# Patient Record
Sex: Female | Born: 1960
Health system: Southern US, Community
[De-identification: ages and names within clinical notes are randomized; demographics above are authoritative.]

## PROBLEM LIST (undated history)

## (undated) DIAGNOSIS — Z972 Presence of dental prosthetic device (complete) (partial): Secondary | ICD-10-CM

## (undated) DIAGNOSIS — E079 Disorder of thyroid, unspecified: Secondary | ICD-10-CM

## (undated) DIAGNOSIS — C801 Malignant (primary) neoplasm, unspecified: Secondary | ICD-10-CM

## (undated) DIAGNOSIS — S161XXA Strain of muscle, fascia and tendon at neck level, initial encounter: Secondary | ICD-10-CM

## (undated) DIAGNOSIS — S39012A Strain of muscle, fascia and tendon of lower back, initial encounter: Secondary | ICD-10-CM

## (undated) DIAGNOSIS — Z973 Presence of spectacles and contact lenses: Secondary | ICD-10-CM

## (undated) DIAGNOSIS — R51 Headache: Secondary | ICD-10-CM

## (undated) HISTORY — DX: Strain of muscle, fascia and tendon of lower back, initial encounter: S39.012A

## (undated) HISTORY — DX: Malignant (primary) neoplasm, unspecified: C80.1

## (undated) HISTORY — DX: Headache: R51

## (undated) HISTORY — DX: Disorder of thyroid, unspecified: E07.9

## (undated) HISTORY — PX: TUBAL LIGATION: SHX77

## (undated) HISTORY — DX: Strain of muscle, fascia and tendon at neck level, initial encounter: S16.1XXA

## (undated) HISTORY — PX: MULTIPLE TOOTH EXTRACTIONS: SHX2053

---

## 1994-11-24 HISTORY — PX: LYMPH NODE BIOPSY: SHX201

## 2010-08-22 ENCOUNTER — Ambulatory Visit: Payer: Self-pay | Admitting: Genetic Counselor

## 2010-08-27 ENCOUNTER — Encounter: Admission: RE | Admit: 2010-08-27 | Discharge: 2010-08-27 | Payer: Self-pay | Admitting: Surgery

## 2010-09-16 ENCOUNTER — Ambulatory Visit (HOSPITAL_COMMUNITY): Admission: RE | Admit: 2010-09-16 | Discharge: 2010-09-16 | Payer: Self-pay | Admitting: Hematology and Oncology

## 2010-09-17 ENCOUNTER — Ambulatory Visit: Payer: Self-pay | Admitting: Cardiology

## 2010-09-20 ENCOUNTER — Ambulatory Visit (HOSPITAL_COMMUNITY): Admission: RE | Admit: 2010-09-20 | Discharge: 2010-09-20 | Payer: Self-pay | Admitting: Surgery

## 2010-11-24 DIAGNOSIS — C801 Malignant (primary) neoplasm, unspecified: Secondary | ICD-10-CM

## 2010-11-24 HISTORY — DX: Malignant (primary) neoplasm, unspecified: C80.1

## 2010-11-24 HISTORY — PX: PORT-A-CATH REMOVAL: SHX5289

## 2010-12-03 ENCOUNTER — Encounter
Admission: RE | Admit: 2010-12-03 | Discharge: 2010-12-03 | Payer: Self-pay | Source: Home / Self Care | Attending: Hematology and Oncology | Admitting: Hematology and Oncology

## 2010-12-14 ENCOUNTER — Encounter: Payer: Self-pay | Admitting: Surgery

## 2011-02-05 LAB — CBC
HCT: 40.4 % (ref 36.0–46.0)
Hemoglobin: 13.7 g/dL (ref 12.0–15.0)
MCH: 29.9 pg (ref 26.0–34.0)
MCHC: 33.9 g/dL (ref 30.0–36.0)
MCV: 88.2 fL (ref 78.0–100.0)
Platelets: 233 10*3/uL (ref 150–400)
RBC: 4.58 MIL/uL (ref 3.87–5.11)
RDW: 12 % (ref 11.5–15.5)
WBC: 7.4 10*3/uL (ref 4.0–10.5)

## 2011-02-05 LAB — GLUCOSE, CAPILLARY: Glucose-Capillary: 110 mg/dL — ABNORMAL HIGH (ref 70–99)

## 2011-02-05 LAB — SURGICAL PCR SCREEN
MRSA, PCR: NEGATIVE
Staphylococcus aureus: NEGATIVE

## 2011-02-17 ENCOUNTER — Other Ambulatory Visit: Payer: Self-pay | Admitting: Hematology and Oncology

## 2011-02-17 DIAGNOSIS — C50911 Malignant neoplasm of unspecified site of right female breast: Secondary | ICD-10-CM

## 2011-02-24 ENCOUNTER — Ambulatory Visit
Admission: RE | Admit: 2011-02-24 | Discharge: 2011-02-24 | Disposition: A | Discharge status: Home / Self Care | Payer: Self-pay | Source: Ambulatory Visit | Attending: Hematology and Oncology | Admitting: Hematology and Oncology

## 2011-02-24 DIAGNOSIS — C50911 Malignant neoplasm of unspecified site of right female breast: Secondary | ICD-10-CM

## 2011-02-24 MED ORDER — GADOBENATE DIMEGLUMINE 529 MG/ML IV SOLN
10.0000 mL | Freq: Once | INTRAVENOUS | Status: AC | PRN
  Administered 2011-02-24: 10 mL via INTRAVENOUS

## 2011-03-06 ENCOUNTER — Other Ambulatory Visit (HOSPITAL_COMMUNITY): Payer: Self-pay | Admitting: Surgery

## 2011-03-06 DIAGNOSIS — C50911 Malignant neoplasm of unspecified site of right female breast: Secondary | ICD-10-CM

## 2011-04-07 ENCOUNTER — Encounter (HOSPITAL_COMMUNITY)
Admission: RE | Admit: 2011-04-07 | Discharge: 2011-04-07 | Disposition: A | Discharge status: Home / Self Care | Payer: BC Managed Care – PPO | Source: Ambulatory Visit | Attending: Surgery | Admitting: Surgery

## 2011-04-07 LAB — BASIC METABOLIC PANEL
BUN: 7 mg/dL (ref 6–23)
CO2: 32 mEq/L (ref 19–32)
Calcium: 10.2 mg/dL (ref 8.4–10.5)
Chloride: 102 mEq/L (ref 96–112)
Creatinine, Ser: 0.65 mg/dL (ref 0.4–1.2)
GFR calc Af Amer: >60 mL/min (ref >60)
GFR calc non Af Amer: >60 mL/min (ref >60)
Glucose, Bld: 101 mg/dL — ABNORMAL HIGH (ref 70–99)
Potassium: 4.2 mEq/L (ref 3.5–5.1)
Sodium: 141 mEq/L (ref 135–145)

## 2011-04-07 LAB — CBC
HCT: 39.9 % (ref 36.0–46.0)
Hemoglobin: 13.9 g/dL (ref 12.0–15.0)
MCH: 31.9 pg (ref 26.0–34.0)
MCHC: 34.8 g/dL (ref 30.0–36.0)
MCV: 91.5 fL (ref 78.0–100.0)
Platelets: 230 10*3/uL (ref 150–400)
RBC: 4.36 MIL/uL (ref 3.87–5.11)
RDW: 12.6 % (ref 11.5–15.5)
WBC: 4.9 10*3/uL (ref 4.0–10.5)

## 2011-04-07 LAB — SURGICAL PCR SCREEN
MRSA, PCR: NEGATIVE
Staphylococcus aureus: NEGATIVE

## 2011-04-10 ENCOUNTER — Ambulatory Visit (HOSPITAL_COMMUNITY)
Admission: RE | Admit: 2011-04-10 | Discharge: 2011-04-10 | Disposition: A | Discharge status: Home / Self Care | Payer: BC Managed Care – PPO | Source: Ambulatory Visit | Attending: Surgery | Admitting: Surgery

## 2011-04-10 ENCOUNTER — Other Ambulatory Visit (INDEPENDENT_AMBULATORY_CARE_PROVIDER_SITE_OTHER): Payer: Self-pay | Admitting: Surgery

## 2011-04-10 ENCOUNTER — Observation Stay (HOSPITAL_COMMUNITY)
Admission: RE | Admit: 2011-04-10 | Discharge: 2011-04-11 | Disposition: A | Discharge status: Home / Self Care | Payer: BC Managed Care – PPO | Source: Ambulatory Visit | Attending: Surgery | Admitting: Surgery

## 2011-04-10 DIAGNOSIS — Z01812 Encounter for preprocedural laboratory examination: Secondary | ICD-10-CM | POA: Insufficient documentation

## 2011-04-10 DIAGNOSIS — Z452 Encounter for adjustment and management of vascular access device: Secondary | ICD-10-CM | POA: Insufficient documentation

## 2011-04-10 DIAGNOSIS — Z79899 Other long term (current) drug therapy: Secondary | ICD-10-CM | POA: Insufficient documentation

## 2011-04-10 DIAGNOSIS — C50119 Malignant neoplasm of central portion of unspecified female breast: Principal | ICD-10-CM | POA: Insufficient documentation

## 2011-04-10 DIAGNOSIS — C50911 Malignant neoplasm of unspecified site of right female breast: Secondary | ICD-10-CM

## 2011-04-10 DIAGNOSIS — F17200 Nicotine dependence, unspecified, uncomplicated: Secondary | ICD-10-CM | POA: Insufficient documentation

## 2011-04-10 HISTORY — PX: BREAST SURGERY: SHX581

## 2011-04-10 MED ORDER — TECHNETIUM TC 99M SULFUR COLLOID FILTERED
1.0000 | Freq: Once | INTRAVENOUS | Status: AC | PRN
  Administered 2011-04-10: 1 via INTRADERMAL

## 2011-04-15 NOTE — Op Note (Signed)
[name], [name]                ACCOUNT NO.:  [id number]  MEDICAL RECORD NO.:  [id number]           PATIENT TYPE:  O  LOCATION:  SDSC                         FACILITY:  [facility]  PHYSICIAN:  Sandria Bales. Ezzard Standing, M.D.  DATE OF BIRTH:  1961-11-06  DATE OF PROCEDURE: 10 Apr 2011                              OPERATIVE REPORT   PREOPERATIVE DIAGNOSIS:  Central right breast cancer.  POSTOPERATIVE DIAGNOSIS:  Central right breast cancer with negative sentinel lymph node biopsy (per Dr. Guerry Bruin).  PROCEDURE:  Injection of methylene blue into right breast, right axillary sentinel lymph node biopsy (blue node with counts of 3300, background of 10), right simple mastectomy, removal of left subclavian Port-A-Cath.  SURGEON:  Sandria Bales. Ezzard Standing, MD  ASSISTANT:  None.  ANESTHESIA:  General endotracheal.  INDICATION FOR PROCEDURE:  Ms. 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  She was found to have a right breast cancer and underwent neoadjuvant chemotherapy supervised by Dr. Isabel Caprice.  Her treatment was completed on February 24, 2011.  She underwent an MRI that showed complete resolution of her tumor.  I have discussed with her about further treatment.  Because her breast was so small and this tumor was subareolar, I think she is best served by having a mastectomy with areolar excision.  We will plan a sentinel lymph node at the same time.  If it is positive, do an axillary node dissection.  I talked to her about breast reconstruction, but at this time she wants to hold off.  There is also a chance she would need radiation therapy post surgery and we will remove her Port-A-Cath at the same time.  The risks of surgery include, but are not limited to bleeding, infection, nerve injury, recurrence of the tumor and the possibility of requiring more surgery.  OPERATIVE NOTE:  The patient placed in supine  position, underwent general anesthesia in room 2 at [facility].  She had been injected in the preoperative area with 1 mCi of technetium sulfur colloid in the right subareolar space for identification of sentinel lymph node.  A time-out was held and the surgical checklist run.  Her right breast was prepped with ChloraPrep and sterilely draped.  Also included the site of the Port-A-Cath in her left subclavian area.  I started with injecting her subareolar area with about 1 cc of methylene blue.  This is for help identifying the sentinel lymph node.  I then made incision in the right axilla where I found a hot blue node.  I excised this node.  Hemostasis was controlled with Bovie electrocautery.  The final counts of the node were about 3300 as the highest with a background in the radiation to the left axilla of about 100.  I then turned my attention to her left mastectomy.  Dr. Guerry Bruin called back and said the sentinel lymph node was negative, so I did no further surgery at this time on her axillary lymph nodes.  I then turned my attention to her right mastectomy.  I made  an elliptical incision including the areola.  I went medially to the edge of the sternum, superiorly to about 3 fingerbreadths below the clavicle and went laterally to the edge of the latissimus dorsi muscle and inferiorly to the investing fascia of the rectus abdominis muscle.  I then excised the breast tissue and the subcutaneous fat with it.  She really has thin and has very very small breasts.  The specimen was removed off the pectoralis major muscle.  Hemostasis was controlled with Bovie electrocautery and then I put a 3-0 silk suture in the lateral part of the specimen, identifying as lateral right breast.  I then brought a Blake drain through a stab wound inferior flap.  I placed this in position under the flap, closed the mastectomy flap with 3-0 Vicryl subcutaneous suture and 4-0 Monocryl subcuticular  suture.  I did the same in the right axilla and then painted the wounds with tincture of benzoin and Steri-Strips.  I then changed gloves, gown, instruments and did the Port-A-Cath removal of the left subclavian space.  I cut down the Port-A-Cath, excised in its entirety.  I closed the space with a 2-0 Vicryl suture, the skin with a 4-0 Monocryl suture.  Steri-Strips on the right chest wall and axilla and Dermabond on the left.  We then dressed it with 4 x 4's and wrapped the chest for pressure.  I will plan to keep her overnight for observation.  She was transferred to recovery room in good condition.    Sandria Bales. Ezzard Standing, M.D., FACS   DHN/MEDQ  D:  04/10/2011  T:  04/10/2011  Job:  [id number]  cc:   Paulene Floor, M.D.  Lynett Fish, M.D.  Electronically Signed by Ovidio Kin M.D. on 04/15/2011 10:50:54 AM

## 2011-04-15 NOTE — Discharge Summary (Signed)
[name], [name]                ACCOUNT NO.:  [id number]  MEDICAL RECORD NO.:  [id number]           PATIENT TYPE:  O  LOCATION:  5159                         FACILITY:  [facility]  PHYSICIAN:  Sandria Bales. Ezzard Standing, M.D.  DATE OF BIRTH:  [birth year]  DATE OF ADMISSION:  04/10/2011 DATE OF DISCHARGE:  04/11/2011                              DISCHARGE SUMMARY   DISCHARGE DIAGNOSES: 1. Right breast cancer, triple negative, status post neoadjuvant     therapy, final pathology pending. 2. Smokes cigarettes. 3. On thyroid replacement. 4. History of kidney stones.  OPERATIONS PERFORMED:  The patient underwent an injection of methylene blue, a right axillary sentinel lymph node biopsy, a right simple mastectomy, and removal of left subclavian Port-A-Cath on Apr 10, 2011.  HISTORY OF PRESENT ILLNESS:  [name] is a 50 year old white female who sees Paulene Floor, family nurse practitioner, at the [facility], who was found to have an asymmetry of her right breast, so a mammogram was done at [facility], read by Lois Huxley.  Pathology by Dr. Broadus John revealed a high-grade invasive ductal carcinoma, which was ER negative, PR negative, HER-2/neu negative and Ki-67 was 56%.  [name] has small breasts.  This tumor seemed to impinge almost the whole depth of her breast tissue of her right breast immediately below the nipple, and discussion was carried out about the options for treatment.  It was felt she would be best served by undergoing neoadjuvant chemotherapy and saw Dr. Isabel Caprice.  She underwent treatment with Adriamycin and Cytoxan followed by Taxol and she has now completed that treatment.  She has had significant resolution of right breast cancer, in which a followup MRI done on February 24, 2011, showed no significant residual enhancement of her right breast.  Her comorbid problems include history of smoking, but she now know it is bad for  health.  She is on thyroid replacement and has a history of kidney stones.  A fairly lengthy discussion was carried out with the patient about the options for treatment.  It was felt because of her small breasts and because of the closeness to the nipple that she was best served with a simple mastectomy including removing the nipple areolar complex.  I discussed with her about breast reconstruction, which at this time she did not want to pursue.  We will do a sentinel lymph node biopsy and if positive, proceed with an axillary node dissection and since her chemotherapy is complete, we will remove her Port-A-Cath.  She was admitted to the hospital on Apr 10, 2011, where she was taken to the operating room where she underwent injection of methylene blue and identification of a right axillary sentinel lymph node, which preliminary reports were negative on pathology.  She had a right simple mastectomy and removal of subclavian Port-A-Cath.  She is now 1 day postop.  She has done well.  She is afebrile.  She understands the management of her Jackson-Pratt drain and is ready for discharge.  She will resume her home medication which includes fluoxetine 10 mg daily, alprazolam 0.25 mg as needed,  and levothyroxine 100 mcg daily.  She can drive after 3 or 4 days.   She can be on a regular diet.   She is to leave the bandage on until Sunday, Apr 13, 2011, when she can remove it to get the shower.   She has an appointment to see me next week and will be given Vicodin for pain.   Her final pathology is pending at the time of this dictation.   Sandria Bales. Ezzard Standing, M.D., FACS   DHN/MEDQ  D:  04/11/2011  T:  04/11/2011  Job:  [id number]  cc:   Paulene Floor, M.D. Lynett Fish, M.D.  Electronically Signed by Ovidio Kin M.D. on 04/15/2011 10:53:32 AM

## 2011-04-23 ENCOUNTER — Encounter (INDEPENDENT_AMBULATORY_CARE_PROVIDER_SITE_OTHER): Payer: Self-pay | Admitting: Surgery

## 2011-09-22 ENCOUNTER — Encounter (INDEPENDENT_AMBULATORY_CARE_PROVIDER_SITE_OTHER): Payer: Self-pay | Admitting: Surgery

## 2011-10-22 ENCOUNTER — Encounter (INDEPENDENT_AMBULATORY_CARE_PROVIDER_SITE_OTHER): Payer: Self-pay | Admitting: Surgery

## 2011-11-06 ENCOUNTER — Encounter (INDEPENDENT_AMBULATORY_CARE_PROVIDER_SITE_OTHER): Payer: Self-pay | Admitting: Surgery

## 2011-11-17 ENCOUNTER — Encounter (INDEPENDENT_AMBULATORY_CARE_PROVIDER_SITE_OTHER): Payer: Self-pay | Admitting: Hematology and Oncology

## 2011-11-20 ENCOUNTER — Telehealth (INDEPENDENT_AMBULATORY_CARE_PROVIDER_SITE_OTHER): Payer: Self-pay | Admitting: Surgery

## 2011-12-25 ENCOUNTER — Encounter (INDEPENDENT_AMBULATORY_CARE_PROVIDER_SITE_OTHER): Payer: Self-pay | Admitting: Surgery

## 2011-12-26 ENCOUNTER — Encounter (INDEPENDENT_AMBULATORY_CARE_PROVIDER_SITE_OTHER): Payer: Self-pay | Admitting: Surgery

## 2011-12-26 ENCOUNTER — Ambulatory Visit (INDEPENDENT_AMBULATORY_CARE_PROVIDER_SITE_OTHER): Payer: BC Managed Care – PPO | Admitting: Surgery

## 2011-12-26 VITALS — BP 130/82 | HR 90 | Temp 97.3°F | Resp 18 | Ht 68.0 in | Wt 103.5 lb

## 2011-12-26 DIAGNOSIS — C50919 Malignant neoplasm of unspecified site of unspecified female breast: Secondary | ICD-10-CM

## 2011-12-26 DIAGNOSIS — C50911 Malignant neoplasm of unspecified site of right female breast: Secondary | ICD-10-CM

## 2011-12-26 NOTE — Progress Notes (Signed)
[facility]  Ovidio Kin, MD,  FACS [address] [place]  [address] [place], [place]    [address] Phone:  [phone] FAX:  [phone]   Re:   [name] DOB:   07/30/1961 MRN:   [id number]  ASSESSMENT AND PLAN: 1.  Right breast cancer, triple negative.  Measured 1.7 cm prior to treatment.  Neoadjuvant chemotx by Dr. Cleone Slim.  CR at mastectomy on 04/10/2011.  Disease free.  She'll see me back in 6 months.  2.  Smokes cigarettes.  HISTORY OF PRESENT ILLNESS: Chief Complaint  Patient presents with  . Breast Cancer Long Term Follow Up    [name] is a 51 y.o. (DOB: 02-21-1961)  white female who is a patient of Bennie Pierini, FNP, FNP and comes to me today for follow up of right mastectomy for right breast cancer.  She looks good. Is in good spirits.  Has no new problems or complaints.  We talked about trying to quit smoking.  She is cutting back and knows that she needs to quit.  PHYSICAL EXAM: BP 130/82  Pulse 90  Temp(Src) 97.3 F (36.3 C) (Temporal)  Resp 18  Ht 5\' 8"  (1.727 m)  Wt 103 lb 8 oz (46.947 kg)  BMI 15.74 kg/m2  HEENT:  Pupils equal.  Has yellowed dentures.  Smells of cigarettes. NECK:  Supple.  No thyroid mass. LYMPH NODES:  No cervical, supraclavicular, or axillary adenopathy. BREASTS -  RIGHT:  No palpable mass or nodule.  Absent right breast.   LEFT:  No palpable mass or nodule.  No nipple discharge. UPPER EXTREMITIES:  No evidence of lymphedema.  DATA REVIEWED: Bilateral MRI on 03/06/2011. Negative left mammogram on 08/05/2011.  Ovidio Kin, MD, FACS Office:  [phone]

## 2012-01-01 ENCOUNTER — Encounter (INDEPENDENT_AMBULATORY_CARE_PROVIDER_SITE_OTHER): Payer: Self-pay

## 2012-03-24 ENCOUNTER — Ambulatory Visit: Payer: BC Managed Care – PPO | Attending: Neurology | Admitting: Physical Therapy

## 2012-03-24 DIAGNOSIS — M545 Low back pain, unspecified: Secondary | ICD-10-CM | POA: Insufficient documentation

## 2012-03-24 DIAGNOSIS — M542 Cervicalgia: Secondary | ICD-10-CM | POA: Insufficient documentation

## 2012-03-24 DIAGNOSIS — R5381 Other malaise: Secondary | ICD-10-CM | POA: Insufficient documentation

## 2012-03-24 DIAGNOSIS — IMO0001 Reserved for inherently not codable concepts without codable children: Secondary | ICD-10-CM | POA: Insufficient documentation

## 2012-03-25 ENCOUNTER — Ambulatory Visit: Payer: BC Managed Care – PPO | Admitting: Physical Therapy

## 2012-03-30 ENCOUNTER — Ambulatory Visit: Payer: BC Managed Care – PPO | Admitting: Physical Therapy

## 2012-04-02 ENCOUNTER — Ambulatory Visit: Payer: BC Managed Care – PPO | Admitting: *Deleted

## 2012-04-07 ENCOUNTER — Ambulatory Visit: Payer: BC Managed Care – PPO | Admitting: Physical Therapy

## 2012-04-08 ENCOUNTER — Ambulatory Visit: Payer: BC Managed Care – PPO | Admitting: Physical Therapy

## 2012-04-13 ENCOUNTER — Ambulatory Visit: Payer: BC Managed Care – PPO | Admitting: Physical Therapy

## 2012-04-16 ENCOUNTER — Ambulatory Visit: Payer: BC Managed Care – PPO | Admitting: Physical Therapy

## 2012-04-21 ENCOUNTER — Ambulatory Visit: Payer: BC Managed Care – PPO | Admitting: Physical Therapy

## 2012-04-22 ENCOUNTER — Ambulatory Visit: Payer: BC Managed Care – PPO | Admitting: Physical Therapy

## 2012-04-26 ENCOUNTER — Ambulatory Visit: Payer: BC Managed Care – PPO | Attending: Neurology | Admitting: Physical Therapy

## 2012-04-26 DIAGNOSIS — M545 Low back pain, unspecified: Secondary | ICD-10-CM | POA: Insufficient documentation

## 2012-04-26 DIAGNOSIS — R5381 Other malaise: Secondary | ICD-10-CM | POA: Insufficient documentation

## 2012-04-26 DIAGNOSIS — IMO0001 Reserved for inherently not codable concepts without codable children: Secondary | ICD-10-CM | POA: Insufficient documentation

## 2012-04-26 DIAGNOSIS — M542 Cervicalgia: Secondary | ICD-10-CM | POA: Insufficient documentation

## 2012-04-30 ENCOUNTER — Ambulatory Visit: Payer: BC Managed Care – PPO | Admitting: Physical Therapy

## 2012-05-05 ENCOUNTER — Ambulatory Visit: Payer: BC Managed Care – PPO | Admitting: Physical Therapy

## 2012-05-06 ENCOUNTER — Ambulatory Visit: Payer: BC Managed Care – PPO | Admitting: Physical Therapy

## 2012-05-10 ENCOUNTER — Ambulatory Visit: Payer: BC Managed Care – PPO | Admitting: *Deleted

## 2012-05-11 ENCOUNTER — Encounter: Payer: BC Managed Care – PPO | Admitting: Hematology and Oncology

## 2012-05-11 DIAGNOSIS — C50919 Malignant neoplasm of unspecified site of unspecified female breast: Secondary | ICD-10-CM

## 2012-05-11 DIAGNOSIS — E039 Hypothyroidism, unspecified: Secondary | ICD-10-CM

## 2012-05-14 ENCOUNTER — Ambulatory Visit: Payer: BC Managed Care – PPO | Admitting: Physical Therapy

## 2012-06-02 ENCOUNTER — Ambulatory Visit: Payer: BC Managed Care – PPO | Attending: Neurology | Admitting: Physical Therapy

## 2012-06-02 DIAGNOSIS — IMO0001 Reserved for inherently not codable concepts without codable children: Secondary | ICD-10-CM | POA: Insufficient documentation

## 2012-06-02 DIAGNOSIS — M545 Low back pain, unspecified: Secondary | ICD-10-CM | POA: Insufficient documentation

## 2012-06-02 DIAGNOSIS — R5381 Other malaise: Secondary | ICD-10-CM | POA: Insufficient documentation

## 2012-06-02 DIAGNOSIS — M542 Cervicalgia: Secondary | ICD-10-CM | POA: Insufficient documentation

## 2012-06-07 ENCOUNTER — Ambulatory Visit: Payer: BC Managed Care – PPO | Admitting: Physical Therapy

## 2012-06-11 ENCOUNTER — Ambulatory Visit: Payer: BC Managed Care – PPO | Admitting: *Deleted

## 2012-06-16 ENCOUNTER — Ambulatory Visit: Payer: BC Managed Care – PPO | Admitting: Physical Therapy

## 2012-06-17 ENCOUNTER — Ambulatory Visit: Payer: BC Managed Care – PPO | Admitting: *Deleted

## 2012-06-21 ENCOUNTER — Ambulatory Visit: Payer: BC Managed Care – PPO | Admitting: Physical Therapy

## 2012-06-25 ENCOUNTER — Ambulatory Visit: Payer: BC Managed Care – PPO | Attending: Family Medicine | Admitting: *Deleted

## 2012-06-25 DIAGNOSIS — M542 Cervicalgia: Secondary | ICD-10-CM | POA: Insufficient documentation

## 2012-06-25 DIAGNOSIS — IMO0001 Reserved for inherently not codable concepts without codable children: Secondary | ICD-10-CM | POA: Insufficient documentation

## 2012-06-25 DIAGNOSIS — M545 Low back pain, unspecified: Secondary | ICD-10-CM | POA: Insufficient documentation

## 2012-06-25 DIAGNOSIS — R5381 Other malaise: Secondary | ICD-10-CM | POA: Insufficient documentation

## 2012-06-30 ENCOUNTER — Ambulatory Visit: Payer: BC Managed Care – PPO | Admitting: Physical Therapy

## 2012-07-01 ENCOUNTER — Ambulatory Visit: Payer: BC Managed Care – PPO | Admitting: Physical Therapy

## 2012-07-23 ENCOUNTER — Ambulatory Visit (INDEPENDENT_AMBULATORY_CARE_PROVIDER_SITE_OTHER): Payer: BC Managed Care – PPO | Admitting: Surgery

## 2012-07-23 ENCOUNTER — Encounter (INDEPENDENT_AMBULATORY_CARE_PROVIDER_SITE_OTHER): Payer: Self-pay | Admitting: Surgery

## 2012-07-23 VITALS — BP 100/70 | HR 84 | Temp 97.0°F | Resp 18 | Ht 68.0 in | Wt 107.0 lb

## 2012-07-23 DIAGNOSIS — C50919 Malignant neoplasm of unspecified site of unspecified female breast: Secondary | ICD-10-CM

## 2012-07-23 DIAGNOSIS — C50911 Malignant neoplasm of unspecified site of right female breast: Secondary | ICD-10-CM

## 2012-07-23 NOTE — Progress Notes (Signed)
[facility]  Ovidio Kin, MD,  FACS [address] [place]  [address] [place], [place]    [address] Phone:  [phone] FAX:  [phone]   Re:   [name] DOB:   1961-06-13 MRN:   [id number]  ASSESSMENT AND PLAN: 1.  Right breast cancer, triple negative.  Measured 1.7 cm prior to treatment.  Neoadjuvant chemotx by Dr. Cleone Slim.  CR at mastectomy on 04/10/2011.  Disease free.  She'll see me back in 6 months.  2.  Whiplash - seeing Dr. Anne Hahn  No obvious fx.  Trouble turning head to left.  From [facility] - 12/01/2011 - she's gone through 24 PT treatments 2.  Smokes cigarettes.  Has tried to stop  HISTORY OF PRESENT ILLNESS: Chief Complaint  Patient presents with  . Breast Cancer Long Term Follow Up    [name] is a 51 y.o. (DOB: 1961/05/14)  white female who is a patient of MARTIN,MARY MARGARET, FNP and comes to me today for follow up of right mastectomy for right breast cancer.  She looks good. Is in good spirits.  Her main complaint is neck pain from whiplash.  She has trouble turning her head to the left.  PHYSICAL EXAM: BP 100/70  Pulse 84  Temp 97 F (36.1 C) (Oral)  Resp 18  Ht 5\' 8"  (1.727 m)  Wt 107 lb (48.535 kg)  BMI 16.27 kg/m2  HEENT:  Pupils equal.  Has yellowed dentures.  Smells of cigarettes. NECK:  Supple.  No thyroid mass. LYMPH NODES:  No cervical, supraclavicular, or axillary adenopathy. BREASTS -  RIGHT:  No palpable mass or nodule.  Absent right breast.   LEFT:  No palpable mass or nodule.  No nipple discharge. UPPER EXTREMITIES:  No evidence of lymphedema.  DATA REVIEWED: Negative left mammogram on 08/05/2011. For mammogram in Sept. 2013. Ovidio Kin, MD, FACS Office:  [phone]

## 2012-08-17 ENCOUNTER — Encounter (INDEPENDENT_AMBULATORY_CARE_PROVIDER_SITE_OTHER): Payer: BC Managed Care – PPO | Admitting: Hematology and Oncology

## 2012-08-17 DIAGNOSIS — M5090 Cervical disc disorder, unspecified, unspecified cervical region: Secondary | ICD-10-CM

## 2012-08-17 DIAGNOSIS — C50919 Malignant neoplasm of unspecified site of unspecified female breast: Secondary | ICD-10-CM

## 2012-08-23 ENCOUNTER — Ambulatory Visit: Payer: BC Managed Care – PPO | Attending: Family Medicine | Admitting: *Deleted

## 2012-08-23 DIAGNOSIS — R5381 Other malaise: Secondary | ICD-10-CM | POA: Insufficient documentation

## 2012-08-23 DIAGNOSIS — M545 Low back pain, unspecified: Secondary | ICD-10-CM | POA: Insufficient documentation

## 2012-08-23 DIAGNOSIS — IMO0001 Reserved for inherently not codable concepts without codable children: Secondary | ICD-10-CM | POA: Insufficient documentation

## 2012-08-23 DIAGNOSIS — M542 Cervicalgia: Secondary | ICD-10-CM | POA: Insufficient documentation

## 2012-08-25 ENCOUNTER — Ambulatory Visit: Payer: BC Managed Care – PPO | Attending: Family Medicine | Admitting: *Deleted

## 2012-08-25 DIAGNOSIS — M542 Cervicalgia: Secondary | ICD-10-CM | POA: Insufficient documentation

## 2012-08-25 DIAGNOSIS — R5381 Other malaise: Secondary | ICD-10-CM | POA: Insufficient documentation

## 2012-08-25 DIAGNOSIS — IMO0001 Reserved for inherently not codable concepts without codable children: Secondary | ICD-10-CM | POA: Insufficient documentation

## 2012-08-25 DIAGNOSIS — M545 Low back pain, unspecified: Secondary | ICD-10-CM | POA: Insufficient documentation

## 2012-08-30 ENCOUNTER — Ambulatory Visit: Payer: BC Managed Care – PPO | Admitting: Physical Therapy

## 2012-09-01 ENCOUNTER — Ambulatory Visit: Payer: BC Managed Care – PPO | Admitting: Physical Therapy

## 2012-09-06 ENCOUNTER — Ambulatory Visit: Payer: BC Managed Care – PPO | Admitting: Physical Therapy

## 2012-09-08 ENCOUNTER — Ambulatory Visit: Payer: BC Managed Care – PPO | Admitting: Physical Therapy

## 2012-09-13 ENCOUNTER — Ambulatory Visit: Payer: BC Managed Care – PPO | Admitting: Physical Therapy

## 2012-09-17 ENCOUNTER — Ambulatory Visit: Payer: BC Managed Care – PPO | Admitting: Physical Therapy

## 2012-09-22 ENCOUNTER — Encounter: Payer: BC Managed Care – PPO | Admitting: Physical Therapy

## 2012-09-23 ENCOUNTER — Ambulatory Visit: Payer: BC Managed Care – PPO | Admitting: Physical Therapy

## 2012-09-27 ENCOUNTER — Ambulatory Visit: Payer: BC Managed Care – PPO | Attending: Family Medicine | Admitting: Physical Therapy

## 2012-09-27 DIAGNOSIS — R5381 Other malaise: Secondary | ICD-10-CM | POA: Insufficient documentation

## 2012-09-27 DIAGNOSIS — IMO0001 Reserved for inherently not codable concepts without codable children: Secondary | ICD-10-CM | POA: Insufficient documentation

## 2012-09-27 DIAGNOSIS — M542 Cervicalgia: Secondary | ICD-10-CM | POA: Insufficient documentation

## 2012-09-27 DIAGNOSIS — M545 Low back pain, unspecified: Secondary | ICD-10-CM | POA: Insufficient documentation

## 2012-12-01 DIAGNOSIS — R519 Headache, unspecified: Secondary | ICD-10-CM | POA: Insufficient documentation

## 2012-12-01 DIAGNOSIS — S139XXA Sprain of joints and ligaments of unspecified parts of neck, initial encounter: Secondary | ICD-10-CM | POA: Insufficient documentation

## 2012-12-01 DIAGNOSIS — S339XXA Sprain of unspecified parts of lumbar spine and pelvis, initial encounter: Secondary | ICD-10-CM | POA: Insufficient documentation

## 2012-12-02 ENCOUNTER — Other Ambulatory Visit: Payer: Self-pay | Admitting: Neurology

## 2012-12-02 DIAGNOSIS — S139XXA Sprain of joints and ligaments of unspecified parts of neck, initial encounter: Secondary | ICD-10-CM

## 2012-12-02 DIAGNOSIS — R519 Headache, unspecified: Secondary | ICD-10-CM

## 2012-12-02 DIAGNOSIS — S161XXA Strain of muscle, fascia and tendon at neck level, initial encounter: Secondary | ICD-10-CM

## 2012-12-09 ENCOUNTER — Ambulatory Visit
Admission: RE | Admit: 2012-12-09 | Discharge: 2012-12-09 | Disposition: A | Discharge status: Home / Self Care | Payer: BC Managed Care – PPO | Source: Ambulatory Visit | Attending: Neurology | Admitting: Neurology

## 2012-12-09 VITALS — BP 150/95 | HR 80

## 2012-12-09 DIAGNOSIS — S139XXA Sprain of joints and ligaments of unspecified parts of neck, initial encounter: Secondary | ICD-10-CM

## 2012-12-09 DIAGNOSIS — R519 Headache, unspecified: Secondary | ICD-10-CM

## 2012-12-09 DIAGNOSIS — M5020 Other cervical disc displacement, unspecified cervical region: Secondary | ICD-10-CM

## 2012-12-09 DIAGNOSIS — S161XXA Strain of muscle, fascia and tendon at neck level, initial encounter: Secondary | ICD-10-CM

## 2012-12-09 MED ORDER — IOHEXOL 300 MG/ML  SOLN
1.0000 mL | Freq: Once | INTRAMUSCULAR | Status: AC | PRN
  Administered 2012-12-09: 1 mL via EPIDURAL

## 2012-12-09 MED ORDER — TRIAMCINOLONE ACETONIDE 40 MG/ML IJ SUSP (RADIOLOGY)
60.0000 mg | Freq: Once | INTRAMUSCULAR | Status: AC
  Administered 2012-12-09: 60 mg via EPIDURAL

## 2012-12-27 ENCOUNTER — Telehealth (INDEPENDENT_AMBULATORY_CARE_PROVIDER_SITE_OTHER): Payer: Self-pay

## 2012-12-27 NOTE — Telephone Encounter (Signed)
V/M appt 02/10/13 @ 210p

## 2013-02-10 ENCOUNTER — Ambulatory Visit (INDEPENDENT_AMBULATORY_CARE_PROVIDER_SITE_OTHER): Payer: BC Managed Care – PPO | Admitting: Surgery

## 2013-02-10 VITALS — BP 128/74 | HR 78 | Temp 97.8°F | Resp 18 | Ht 67.5 in | Wt 115.0 lb

## 2013-02-10 DIAGNOSIS — C50911 Malignant neoplasm of unspecified site of right female breast: Secondary | ICD-10-CM

## 2013-02-10 DIAGNOSIS — C50919 Malignant neoplasm of unspecified site of unspecified female breast: Secondary | ICD-10-CM

## 2013-02-10 NOTE — Progress Notes (Signed)
[facility]  Ovidio Kin, MD,  FACS [address] [place]  [address] [place], [place]    [address] Phone:  [phone] FAX:  [phone]   Re:   [name] DOB:   Apr 07, 1961 MRN:   [id number]  ASSESSMENT AND PLAN: 1.  Right breast cancer, triple negative.  (T1c,N0)  Measured 1.7 cm prior to treatment.  Neoadjuvant chemotx by Dr. Cleone Slim.  She is to see Dr. Arcelia Jew in follow up.  CR at mastectomy on 04/10/2011.  Disease free.  She'll see me back in 6 months.  2.  Whiplash - from [facility] - 12/01/2011 - seeing Dr. Anne Hahn  She' went through 24 PT treatments  She got an injection of her C spine - 12/09/2012. 2.  Smokes cigarettes.  Has tried to stop  HISTORY OF PRESENT ILLNESS: Chief Complaint  Patient presents with  . Breast Cancer Long Term Follow Up   [name] is a 52 y.o. (DOB: 07/04/1961)  white female who is a patient of MARTIN,MARY MARGARET, FNP and comes to me today for follow up of right mastectomy for right breast cancer.  She looks good. Is in good spirits. She got an injection of her C spine - 12/09/2012.  Her neck is better. She is still trying to quit smoking.  She smokes about 2-4 cigs per day.  Social History: She works at [employer]. Her daughter bugs her about quitting smoking.  PHYSICAL EXAM: BP 128/74  Pulse 78  Temp(Src) 97.8 F (36.6 C)  Resp 18  Ht 5' 7.5" (1.715 m)  Wt 115 lb (52.164 kg)  BMI 17.74 kg/m2  HEENT:  Pupils equal.  Has yellowed dentures.  Smells of cigarettes. NECK:  Supple.  No thyroid mass. LYMPH NODES:  No cervical, supraclavicular, or axillary adenopathy. BREASTS -  RIGHT:  No palpable mass or nodule.  Absent right breast.   LEFT:  No palpable mass or nodule.  No nipple discharge. UPPER EXTREMITIES:  No evidence of lymphedema.  DATA REVIEWED: Negative left mammogram - Sept 2013. For mammogram in Sept. 2014.  Ovidio Kin, MD, FACS Office:  [phone]

## 2013-02-15 ENCOUNTER — Encounter (INDEPENDENT_AMBULATORY_CARE_PROVIDER_SITE_OTHER): Payer: BC Managed Care – PPO | Admitting: Internal Medicine

## 2013-02-15 DIAGNOSIS — C50919 Malignant neoplasm of unspecified site of unspecified female breast: Secondary | ICD-10-CM

## 2013-02-15 DIAGNOSIS — F411 Generalized anxiety disorder: Secondary | ICD-10-CM

## 2013-02-28 ENCOUNTER — Other Ambulatory Visit: Payer: Self-pay | Admitting: Neurology

## 2013-03-23 ENCOUNTER — Ambulatory Visit (INDEPENDENT_AMBULATORY_CARE_PROVIDER_SITE_OTHER): Payer: BC Managed Care – PPO | Admitting: Neurology

## 2013-03-23 ENCOUNTER — Encounter: Payer: Self-pay | Admitting: Neurology

## 2013-03-23 VITALS — BP 126/84 | HR 89 | Ht 68.0 in | Wt 117.0 lb

## 2013-03-23 DIAGNOSIS — R51 Headache: Secondary | ICD-10-CM

## 2013-03-23 DIAGNOSIS — S339XXA Sprain of unspecified parts of lumbar spine and pelvis, initial encounter: Secondary | ICD-10-CM

## 2013-03-23 DIAGNOSIS — S139XXA Sprain of joints and ligaments of unspecified parts of neck, initial encounter: Secondary | ICD-10-CM

## 2013-03-23 DIAGNOSIS — Z5189 Encounter for other specified aftercare: Secondary | ICD-10-CM

## 2013-03-23 DIAGNOSIS — S139XXD Sprain of joints and ligaments of unspecified parts of neck, subsequent encounter: Secondary | ICD-10-CM

## 2013-03-23 NOTE — Progress Notes (Signed)
Reason for visit: Cervical strain  [name] is an 52 y.o. female  History of present illness:  [name] is a 52 year old right-handed white female with a history of a cervical strain syndrome. The patient has done much better since last seen. The patient had an epidural steroid injection, and she believes that this helped significantly. The patient indicates that she is still on the nortriptyline at 20 mg at night, and she is having some days when she feels normal. The pain and mobility of the cervical spine had dramatically improved. The patient turned for an evaluation.  Past Medical History  Diagnosis Date  . Chronic kidney disease   . Cancer 2012    Breast  . Thyroid disease   . Headache   . Cervical muscle strain   . Lumbar strain     Past Surgical History  Procedure Laterality Date  . Breast surgery  04/10/2011    Right mastectomy and slnbx    Family History  Problem Relation Age of Onset  . Stroke Mother   . Hypertension Mother   . Heart disease Mother   . Cancer Maternal Grandmother     Breast cancer    Social history:  reports that she has quit smoking. She does not have any smokeless tobacco history on file. She reports that she drinks about 1.0 ounces of alcohol per week. She reports that she does not use illicit drugs.  Allergies:  Allergies  Allergen Reactions  . Sulfa Antibiotics Other (Rowe Comments)    Makes mouth raw  . Codeine Nausea And Vomiting    Medications:  Current Outpatient Prescriptions on File Prior to Visit  Medication Sig Dispense Refill  . ALPRAZolam (XANAX) 0.25 MG tablet Take 0.25 mg by mouth daily. Take 2 a day      . Calcium Carbonate-Vitamin D (CALCIUM + D PO) Take by mouth.        . meloxicam (MOBIC) 15 MG tablet TAKE ONE TABLET BY MOUTH EVERY DAY WITH FOOD  90 tablet  2  . Multiple Vitamin (MULTIVITAMIN) capsule Take 1 capsule by mouth daily.        . nortriptyline (PAMELOR) 10 MG capsule Take 10 mg by mouth 2 (two)  times daily.      Marland Kitchen venlafaxine (EFFEXOR) 37.5 MG tablet Take 37.5 mg by mouth daily.       No current facility-administered medications on file prior to visit.    ROS:  Out of a complete 14 system review of symptoms, the patient complains only of the following symptoms, and all other reviewed systems are negative.  Achy muscles  Blood pressure 126/84, pulse 89, height 5\' 8"  (1.727 m), weight 117 lb (53.071 kg).  Physical Exam  General: The patient is alert and cooperative at the time of the examination.  Neuromuscular: The patient lacks only about 5 or 10 of full lateral rotation with rotational movement of the cervical spine.  Skin: No significant peripheral edema is noted.   Neurologic Exam  Cranial nerves: Facial symmetry is present. Speech is normal, no aphasia or dysarthria is noted. Extraocular movements are full. Visual fields are full.  Motor: The patient has good strength in all 4 extremities.  Coordination: The patient has good finger-nose-finger and heel-to-shin bilaterally.  Gait and station: The patient has a normal gait. Tandem gait is normal. Romberg is negative. No drift is seen.  Reflexes: Deep tendon reflexes are symmetric.   Assessment/Plan:  One. Cervical strain syndrome  The patient  is doing much better with her underlying neuromuscular pain. The patient will be set up for another epidural steroid injection, and she will followup through this office in about 4 months. The patient is to remain on nortriptyline for now.  Marlan Palau MD 03/23/2013 7:45 PM  [facility] [address] [place], [place] [address]  Phone [phone] Fax [phone]

## 2013-03-24 ENCOUNTER — Other Ambulatory Visit: Payer: Self-pay | Admitting: Neurology

## 2013-03-25 ENCOUNTER — Other Ambulatory Visit: Payer: Self-pay | Admitting: Neurology

## 2013-03-25 DIAGNOSIS — M542 Cervicalgia: Secondary | ICD-10-CM

## 2013-04-06 ENCOUNTER — Ambulatory Visit
Admission: RE | Admit: 2013-04-06 | Discharge: 2013-04-06 | Disposition: A | Discharge status: Home / Self Care | Payer: BC Managed Care – PPO | Source: Ambulatory Visit | Attending: Neurology | Admitting: Neurology

## 2013-04-06 DIAGNOSIS — M542 Cervicalgia: Secondary | ICD-10-CM

## 2013-04-06 MED ORDER — IOHEXOL 300 MG/ML  SOLN
1.0000 mL | Freq: Once | INTRAMUSCULAR | Status: AC | PRN
  Administered 2013-04-06: 1 mL via EPIDURAL

## 2013-04-06 MED ORDER — TRIAMCINOLONE ACETONIDE 40 MG/ML IJ SUSP (RADIOLOGY)
60.0000 mg | Freq: Once | INTRAMUSCULAR | Status: AC
  Administered 2013-04-06: 60 mg via EPIDURAL

## 2013-08-15 ENCOUNTER — Ambulatory Visit (INDEPENDENT_AMBULATORY_CARE_PROVIDER_SITE_OTHER): Payer: BC Managed Care – PPO | Admitting: Family Medicine

## 2013-08-15 ENCOUNTER — Telehealth: Payer: Self-pay | Admitting: Nurse Practitioner

## 2013-08-15 ENCOUNTER — Ambulatory Visit (INDEPENDENT_AMBULATORY_CARE_PROVIDER_SITE_OTHER): Payer: BC Managed Care – PPO

## 2013-08-15 ENCOUNTER — Encounter: Payer: Self-pay | Admitting: Family Medicine

## 2013-08-15 VITALS — BP 143/87 | HR 96 | Temp 97.0°F | Ht 68.25 in | Wt 111.0 lb

## 2013-08-15 DIAGNOSIS — J209 Acute bronchitis, unspecified: Secondary | ICD-10-CM

## 2013-08-15 DIAGNOSIS — R059 Cough, unspecified: Secondary | ICD-10-CM

## 2013-08-15 DIAGNOSIS — C50911 Malignant neoplasm of unspecified site of right female breast: Secondary | ICD-10-CM

## 2013-08-15 DIAGNOSIS — R05 Cough: Secondary | ICD-10-CM

## 2013-08-15 DIAGNOSIS — C50919 Malignant neoplasm of unspecified site of unspecified female breast: Secondary | ICD-10-CM

## 2013-08-15 DIAGNOSIS — Z87891 Personal history of nicotine dependence: Secondary | ICD-10-CM

## 2013-08-15 MED ORDER — LEVOFLOXACIN 750 MG PO TABS: 750.0000 mg | ORAL_TABLET | Freq: Every day | ORAL | Status: DC

## 2013-08-15 NOTE — Telephone Encounter (Signed)
Appt given for today 

## 2013-08-15 NOTE — Progress Notes (Signed)
Patient ID: [name], female   DOB: [birth year], 52 y.o.   MRN: [id number] SUBJECTIVE: CC: Chief Complaint  Patient presents with  . Acute Visit    sinus cough and side hurts from coughing     HPI: As above. Left  Chest sore when she coughs. H/o breast cancer. Ex-smoker. No hemoptysis. No sob, no wheezing. No fever. Her sinuses are congested.   Past Medical History  Diagnosis Date  . Chronic kidney disease   . Thyroid disease   . Headache(784.0)   . Cervical muscle strain   . Lumbar strain   . Cancer 2012    Breast   Past Surgical History  Procedure Laterality Date  . Breast surgery  04/10/2011    Right mastectomy and slnbx   History   Social History  . Marital Status: Divorced    Spouse Name: N/A    Number of Children: 1  . Years of Education: HS   Occupational History  .     Social History Main Topics  . Smoking status: Former Smoker -- 0.50 packs/day  . Smokeless tobacco: Not on file  . Alcohol Use: 1.0 oz/week    2 drink(s) per week     Comment: 1-2 DRINKS WEEKLY  . Drug Use: No  . Sexual Activity: Not on file   Other Topics Concern  . Not on file   Social History Narrative  . No narrative on file   Family History  Problem Relation Age of Onset  . Stroke Mother   . Hypertension Mother   . Heart disease Mother   . Cancer Maternal Grandmother     Breast cancer   Current Outpatient Prescriptions on File Prior to Visit  Medication Sig Dispense Refill  . ALPRAZolam (XANAX) 0.25 MG tablet Take 0.25 mg by mouth daily. Take 2 a day      . Calcium Carbonate-Vitamin D (CALCIUM + D PO) Take by mouth.        . levothyroxine (SYNTHROID, LEVOTHROID) 88 MCG tablet Take 88 mcg by mouth daily before breakfast.      . meloxicam (MOBIC) 15 MG tablet TAKE ONE TABLET BY MOUTH EVERY DAY WITH FOOD  90 tablet  2  . Multiple Vitamin (MULTIVITAMIN) capsule Take 1 capsule by mouth daily.        . nortriptyline (PAMELOR) 10 MG capsule Take 10 mg by mouth 2 (two) times  daily.      Marland Kitchen venlafaxine (EFFEXOR) 37.5 MG tablet Take 37.5 mg by mouth daily.       No current facility-administered medications on file prior to visit.   Allergies  Allergen Reactions  . Sulfa Antibiotics Other (See Comments)    Makes mouth raw  . Codeine Nausea And Vomiting    There is no immunization history on file for this patient. Prior to Admission medications   Medication Sig Start Date End Date Taking? Authorizing Provider  ALPRAZolam (XANAX) 0.25 MG tablet Take 0.25 mg by mouth daily. Take 2 a day   Yes Historical Provider, MD  Calcium Carbonate-Vitamin D (CALCIUM + D PO) Take by mouth.     Yes Historical Provider, MD  levothyroxine (SYNTHROID, LEVOTHROID) 88 MCG tablet Take 88 mcg by mouth daily before breakfast.   Yes Historical Provider, MD  meloxicam (MOBIC) 15 MG tablet TAKE ONE TABLET BY MOUTH EVERY DAY WITH FOOD 02/28/13  Yes York Spaniel, MD  Multiple Vitamin (MULTIVITAMIN) capsule Take 1 capsule by mouth daily.     Yes Historical  Provider, MD  nortriptyline (PAMELOR) 10 MG capsule Take 10 mg by mouth 2 (two) times daily.   Yes Historical Provider, MD  venlafaxine (EFFEXOR) 37.5 MG tablet Take 37.5 mg by mouth daily.   Yes Historical Provider, MD   Past Medical History  Diagnosis Date  . Chronic kidney disease   . Thyroid disease   . Headache(784.0)   . Cervical muscle strain   . Lumbar strain   . Cancer 2012    Breast   Past Surgical History  Procedure Laterality Date  . Breast surgery  04/10/2011    Right mastectomy and slnbx   History   Social History  . Marital Status: Divorced    Spouse Name: N/A    Number of Children: 1  . Years of Education: HS   Occupational History  .     Social History Main Topics  . Smoking status: Former Smoker -- 0.50 packs/day  . Smokeless tobacco: Not on file  . Alcohol Use: 1.0 oz/week    2 drink(s) per week     Comment: 1-2 DRINKS WEEKLY  . Drug Use: No  . Sexual Activity: Not on file   Other Topics  Concern  . Not on file   Social History Narrative  . No narrative on file   Family History  Problem Relation Age of Onset  . Stroke Mother   . Hypertension Mother   . Heart disease Mother   . Cancer Maternal Grandmother     Breast cancer   Current Outpatient Prescriptions on File Prior to Visit  Medication Sig Dispense Refill  . ALPRAZolam (XANAX) 0.25 MG tablet Take 0.25 mg by mouth daily. Take 2 a day      . Calcium Carbonate-Vitamin D (CALCIUM + D PO) Take by mouth.        . levothyroxine (SYNTHROID, LEVOTHROID) 88 MCG tablet Take 88 mcg by mouth daily before breakfast.      . meloxicam (MOBIC) 15 MG tablet TAKE ONE TABLET BY MOUTH EVERY DAY WITH FOOD  90 tablet  2  . Multiple Vitamin (MULTIVITAMIN) capsule Take 1 capsule by mouth daily.        . nortriptyline (PAMELOR) 10 MG capsule Take 10 mg by mouth 2 (two) times daily.      Marland Kitchen venlafaxine (EFFEXOR) 37.5 MG tablet Take 37.5 mg by mouth daily.       No current facility-administered medications on file prior to visit.   Allergies  Allergen Reactions  . Sulfa Antibiotics Other (See Comments)    Makes mouth raw  . Codeine Nausea And Vomiting    There is no immunization history on file for this patient. Prior to Admission medications   Medication Sig Start Date End Date Taking? Authorizing Provider  ALPRAZolam (XANAX) 0.25 MG tablet Take 0.25 mg by mouth daily. Take 2 a day   Yes Historical Provider, MD  Calcium Carbonate-Vitamin D (CALCIUM + D PO) Take by mouth.     Yes Historical Provider, MD  levothyroxine (SYNTHROID, LEVOTHROID) 88 MCG tablet Take 88 mcg by mouth daily before breakfast.   Yes Historical Provider, MD  meloxicam (MOBIC) 15 MG tablet TAKE ONE TABLET BY MOUTH EVERY DAY WITH FOOD 02/28/13  Yes York Spaniel, MD  Multiple Vitamin (MULTIVITAMIN) capsule Take 1 capsule by mouth daily.     Yes Historical Provider, MD  nortriptyline (PAMELOR) 10 MG capsule Take 10 mg by mouth 2 (two) times daily.   Yes Historical  Provider, MD  venlafaxine [facility])  37.5 MG tablet Take 37.5 mg by mouth daily.   Yes Historical Provider, MD     ROS: As above in the HPI. All other systems are stable or negative.  OBJECTIVE: APPEARANCE:  Patient in no acute distress.The patient appeared well nourished and normally developed. Acyanotic. Waist: VITAL SIGNS:BP 143/87  Pulse 96  Temp(Src) 97 F (36.1 C) (Oral)  Ht 5' 8.25" (1.734 m)  Wt 111 lb (50.349 kg)  BMI 16.75 kg/m2 WF  SKIN: warm and  Dry without overt rashes, tattoos and scars  HEAD and Neck: without JVD, Head and scalp: normal Eyes:No scleral icterus. Fundi normal, eye movements normal. Ears: Auricle normal, canal normal, Tympanic membranes normal, insufflation normal. Nose: normal Throat: normal Neck & thyroid: normal  CHEST & LUNGS: Chest wall: normal Lungs: Clear  CVS: Reveals the PMI to be normally located. Regular rhythm, First and Second Heart sounds are normal,  absence of murmurs, rubs or gallops. Peripheral vasculature: Radial pulses: normal Dorsal pedis pulses: normal Posterior pulses: normal  ABDOMEN:  Appearance: normal Benign, no organomegaly, no masses, no Abdominal Aortic enlargement. No Guarding , no rebound. No Bruits. Bowel sounds: normal  RECTAL: N/A GU: N/A  EXTREMETIES: nonedematous.  MUSCULOSKELETAL:  Spine: normal Joints: intact  NEUROLOGIC: oriented to time,place and person; nonfocal. Strength is normal Sensory is normal Reflexes are normal Cranial Nerves are normal.  ASSESSMENT: Cough - Plan: DG Chest 2 View, levofloxacin (LEVAQUIN) 750 MG tablet  Breast cancer, right breast, Triple negative, CR to neoaduvant chemo, mastectomy 04/10/2011. - Plan: levofloxacin (LEVAQUIN) 750 MG tablet  Acute bronchitis - Plan: levofloxacin (LEVAQUIN) 750 MG tablet  Ex-smoker - Plan: levofloxacin (LEVAQUIN) 750 MG tablet   PLAN: Orders Placed This Encounter  Procedures  . DG Chest 2 View    Standing Status:  Future     Number of Occurrences: 1     Standing Expiration Date: 10/15/2014    Order Specific Question:  Reason for Exam (SYMPTOM  OR DIAGNOSIS REQUIRED)    Answer:  cough    Order Specific Question:  Is the patient pregnant?    Answer:  No    Order Specific Question:  Preferred imaging location?    Answer:  Internal   Meds ordered this encounter  Medications  . levofloxacin (LEVAQUIN) 750 MG tablet    Sig: Take 1 tablet (750 mg total) by mouth daily.    Dispense:  7 tablet    Refill:  0   [facility] reading (PRIMARY) by  Dr. Modesto Charon: hyperinflation, small area of infiltrate LLL vs nipple shadow.  Return in about 10 days (around 08/25/2013) for recheck cough.  Note for work.  Blakelynn Scheeler P. Modesto Charon, M.D.

## 2013-08-19 ENCOUNTER — Encounter: Payer: Self-pay | Admitting: Neurology

## 2013-08-19 ENCOUNTER — Ambulatory Visit (INDEPENDENT_AMBULATORY_CARE_PROVIDER_SITE_OTHER): Payer: BC Managed Care – PPO | Admitting: Neurology

## 2013-08-19 VITALS — BP 130/84 | HR 97 | Ht 68.0 in | Wt 112.0 lb

## 2013-08-19 DIAGNOSIS — S139XXA Sprain of joints and ligaments of unspecified parts of neck, initial encounter: Secondary | ICD-10-CM

## 2013-08-19 DIAGNOSIS — S139XXS Sprain of joints and ligaments of unspecified parts of neck, sequela: Secondary | ICD-10-CM

## 2013-08-19 NOTE — Progress Notes (Signed)
Reason for visit: Cervical spondylosis  [name] is an 52 y.o. female  History of present illness:  [name] is a 52 year old right-handed white female with a history of cervical spondylosis and cervical strain syndrome. The patient has done relatively well over time, when last seen in April of 2014, the patient underwent another epidural steroid injection that seemed to help. The patient is on nortriptyline and Mobic which has potentiated her benefit. The patient overall is doing quite well. The patient recently was placed on Levaquin for pneumonia. The patient returns for an evaluation.  Past Medical History  Diagnosis Date  . Chronic kidney disease   . Thyroid disease   . Headache(784.0)   . Cervical muscle strain   . Lumbar strain   . Cancer 2012    Breast    Past Surgical History  Procedure Laterality Date  . Breast surgery  04/10/2011    Right mastectomy and slnbx    Family History  Problem Relation Age of Onset  . Stroke Mother   . Hypertension Mother   . Heart disease Mother   . Cancer Maternal Grandmother     Breast cancer    Social history:  reports that she has quit smoking. Her smoking use included Cigarettes. She has a 15 pack-year smoking history. She has never used smokeless tobacco. She reports that she drinks about 1.2 ounces of alcohol per week. She reports that she does not use illicit drugs.    Allergies  Allergen Reactions  . Sulfa Antibiotics Other (Rowe Comments)    Makes mouth raw  . Codeine Nausea And Vomiting    Medications:  Current Outpatient Prescriptions on File Prior to Visit  Medication Sig Dispense Refill  . ALPRAZolam (XANAX) 0.25 MG tablet Take 0.25 mg by mouth 2 (two) times daily as needed. Take 2 a day      . Calcium Carbonate-Vitamin D (CALCIUM + D PO) Take by mouth.        . levofloxacin (LEVAQUIN) 750 MG tablet Take 1 tablet (750 mg total) by mouth daily.  7 tablet  0  . levothyroxine (SYNTHROID, LEVOTHROID) 88 MCG  tablet Take 88 mcg by mouth daily before breakfast.      . meloxicam (MOBIC) 15 MG tablet TAKE ONE TABLET BY MOUTH EVERY DAY WITH FOOD  90 tablet  2  . Multiple Vitamin (MULTIVITAMIN) capsule Take 1 capsule by mouth daily.        . nortriptyline (PAMELOR) 10 MG capsule Take 10 mg by mouth 2 (two) times daily.      Marland Kitchen venlafaxine (EFFEXOR) 37.5 MG tablet Take 37.5 mg by mouth daily.       No current facility-administered medications on file prior to visit.    ROS:  Out of a complete 14 system review of symptoms, the patient complains only of the following symptoms, and all other reviewed systems are negative.  Cough  Blood pressure 130/84, pulse 97, height 5\' 8"  (1.727 m), weight 112 lb (50.803 kg).  Physical Exam  General: The patient is alert and cooperative at the time of the examination.  Neuromuscular: The patient has good range of movement of the cervical spine, some crepitus is noted.  Skin: No significant peripheral edema is noted.   Neurologic Exam  Cranial nerves: Facial symmetry is present. Speech is normal, no aphasia or dysarthria is noted. Extraocular movements are full. Visual fields are full.  Motor: The patient has good strength in all 4 extremities.  Coordination: The  patient has good finger-nose-finger and heel-to-shin bilaterally.  Gait and station: The patient has a normal gait. Tandem gait is normal. Romberg is negative. No drift is seen.  Reflexes: Deep tendon reflexes are symmetric.   Assessment/Plan:  One. Cervical spondylosis, cervical strain  The patient doing well so well at this point. We will continue low-dose nortriptyline and Mobic through the winter, and if she continues to do well, this could be discontinued upon revisit. The patient seems to be at or near her baseline.  Susan Palau MD 08/19/2013 8:19 AM  [facility] [address] [place], [place] [address]  Phone [phone] Fax [phone]

## 2013-08-25 ENCOUNTER — Ambulatory Visit: Payer: BC Managed Care – PPO | Admitting: Family Medicine

## 2013-10-12 ENCOUNTER — Ambulatory Visit (INDEPENDENT_AMBULATORY_CARE_PROVIDER_SITE_OTHER): Payer: BC Managed Care – PPO | Admitting: Family Medicine

## 2013-10-12 VITALS — BP 132/80 | HR 99 | Temp 97.7°F | Ht 68.0 in | Wt 114.0 lb

## 2013-10-12 DIAGNOSIS — M79645 Pain in left finger(s): Secondary | ICD-10-CM

## 2013-10-12 DIAGNOSIS — M79609 Pain in unspecified limb: Secondary | ICD-10-CM

## 2013-10-13 ENCOUNTER — Ambulatory Visit (INDEPENDENT_AMBULATORY_CARE_PROVIDER_SITE_OTHER): Payer: BC Managed Care – PPO

## 2013-10-13 DIAGNOSIS — M79645 Pain in left finger(s): Secondary | ICD-10-CM

## 2013-10-13 DIAGNOSIS — M79609 Pain in unspecified limb: Secondary | ICD-10-CM

## 2013-10-14 NOTE — Progress Notes (Signed)
  Subjective:    Patient ID: Hall Busing, female    DOB: 1961-09-10, 52 y.o.   MRN: [id number]  HPI This 52 y.o. female presents for evaluation of pain and swelling in her fifth finger. She fell and now her left hand and pinky finger hurt.   Review of Systems No chest pain, SOB, HA, dizziness, vision change, N/V, diarrhea, constipation, dysuria, urinary urgency or frequency, myalgias, arthralgias or rash.     Objective:   Physical Exam  Vital signs noted  Well developed well nourished female.  Respiratory - Lungs CTA bilateral Cardiac - RRR S1 and S2 without murmur  GI - Abdomen soft Nontender and bowel sounds active x 4 Extremities - No edema. Neuro - Grossly intact. MS - Left fifth finger with swelling at DIP  Xray - fracture of distal fifth metacarpal     Assessment & Plan:  Finger pain, left - Plan: DG Finger Little Left Splint applied and will refer to orthopedics.  Deatra Canter FNP

## 2013-10-17 ENCOUNTER — Encounter: Payer: Self-pay | Admitting: Family Medicine

## 2013-10-17 NOTE — Addendum Note (Signed)
Addended by: Bearl Mulberry on: 10/17/2013 02:33 PM   Modules accepted: Orders

## 2014-02-17 ENCOUNTER — Ambulatory Visit (INDEPENDENT_AMBULATORY_CARE_PROVIDER_SITE_OTHER): Payer: BC Managed Care – PPO | Admitting: Nurse Practitioner

## 2014-02-17 ENCOUNTER — Encounter: Payer: Self-pay | Admitting: Nurse Practitioner

## 2014-02-17 ENCOUNTER — Encounter (INDEPENDENT_AMBULATORY_CARE_PROVIDER_SITE_OTHER): Payer: Self-pay

## 2014-02-17 VITALS — BP 156/97 | HR 92 | Ht 68.0 in | Wt 116.0 lb

## 2014-02-17 DIAGNOSIS — S139XXA Sprain of joints and ligaments of unspecified parts of neck, initial encounter: Secondary | ICD-10-CM

## 2014-02-17 DIAGNOSIS — R51 Headache: Secondary | ICD-10-CM

## 2014-02-17 MED ORDER — NORTRIPTYLINE HCL 10 MG PO CAPS: 10.0000 mg | ORAL_CAPSULE | Freq: Two times a day (BID) | ORAL | Status: DC

## 2014-02-17 NOTE — Progress Notes (Signed)
[facility]  PATIENT: [name] DOB: 03-06-1961   REASON FOR VISIT: Followup for cervical strain syndrome   HISTORY OF PRESENT ILLNESS: [name] returns for followup. In terms of her neck pain she is doing better and wants to get off of her Mobic. She intermittently does her exercises which have been beneficial. Pamelor has been good for her headaches. She returns for reevaluation  HISTORY:  of cervical spondylosis and cervical strain syndrome. The patient has done relatively well over time, when last seen in April of 2014, the patient underwent another epidural steroid injection that seemed to help. The patient is on nortriptyline and Mobic which has potentiated her benefit. The patient overall is doing quite well. The patient recently was placed on Levaquin for pneumonia. The patient returns for an evaluation.   REVIEW OF SYSTEMS: Full 14 system review of systems performed and notable only for those listed, all others are neg:  Constitutional: N/A  Cardiovascular: N/A  Ear/Nose/Throat: N/A  Skin: N/A  Eyes: N/A  Respiratory: N/A  Gastroitestinal: N/A  Hematology/Lymphatic: N/A  Endocrine: N/A Musculoskeletal:N/A  Allergy/Immunology: N/A  Neurological: N/A Psychiatric: N/A   ALLERGIES: Allergies  Allergen Reactions  . Sulfa Antibiotics Other (See Comments)    Makes mouth raw  . Codeine Nausea And Vomiting    HOME MEDICATIONS: Outpatient Prescriptions Prior to Visit  Medication Sig Dispense Refill  . Calcium Carbonate-Vitamin D (CALCIUM + D PO) Take by mouth.        . levothyroxine (SYNTHROID, LEVOTHROID) 88 MCG tablet Take 88 mcg by mouth daily before breakfast.      . meloxicam (MOBIC) 15 MG tablet TAKE ONE TABLET BY MOUTH EVERY DAY WITH FOOD  90 tablet  2  . Multiple Vitamin (MULTIVITAMIN) capsule Take 1 capsule by mouth daily.        . nortriptyline (PAMELOR) 10 MG capsule Take 10 mg by mouth 2 (two) times daily.      Marland Kitchen venlafaxine  (EFFEXOR) 37.5 MG tablet Take 37.5 mg by mouth daily.      Marland Kitchen ALPRAZolam (XANAX) 0.25 MG tablet Take 0.25 mg by mouth 2 (two) times daily as needed. Take 2 a day       No facility-administered medications prior to visit.    PAST MEDICAL HISTORY: Past Medical History  Diagnosis Date  . Chronic kidney disease   . Thyroid disease   . Headache(784.0)   . Cervical muscle strain   . Lumbar strain   . Cancer 2012    Breast    PAST SURGICAL HISTORY: Past Surgical History  Procedure Laterality Date  . Breast surgery  04/10/2011    Right mastectomy and slnbx    FAMILY HISTORY: Family History  Problem Relation Age of Onset  . Stroke Mother   . Hypertension Mother   . Heart disease Mother   . Cancer Maternal Grandmother     Breast cancer    SOCIAL HISTORY: History   Social History  . Marital Status: Divorced    Spouse Name: N/A    Number of Children: 1  . Years of Education: HS   Occupational History  .      [employer]   Social History Main Topics  . Smoking status: Former Smoker -- 0.50 packs/day for 30 years    Types: Cigarettes  . Smokeless tobacco: Never Used  . Alcohol Use: 1.2 oz/week    2 Cans of beer per week     Comment: 1-2 DRINKS WEEKLY  .  Drug Use: No  . Sexual Activity: Not on file   Other Topics Concern  . Not on file   Social History Narrative   Patient lives at home with her boyfriend and daughter. Patient works Full time.    High school education.   Right handed.   Caffeine- two cups daily- coffee.     PHYSICAL EXAM  Filed Vitals:   02/17/14 1057  BP: 156/97  Pulse: 92  Height: 5\' 8"  (1.727 m)  Weight: 116 lb (52.617 kg)   Body mass index is 17.64 kg/(m^2).  Generalized: Well developed, in no acute distress  Neck: Supple, good ROM  Neurological examination   Mentation: Alert oriented to time, place, history taking. Follows all commands speech and language fluent  Cranial nerve II-XII: Pupils were equal round reactive to light  extraocular movements were full, visual field were full on confrontational test. Facial sensation and strength were normal. hearing was intact to finger rubbing bilaterally. Uvula tongue midline. head turning and shoulder shrug were normal and symmetric.Tongue protrusion into cheek strength was normal. Motor: normal bulk and tone, full strength in the BUE, BLE, fine finger movements normal, no pronator drift. No focal weakness Coordination: finger-nose-finger, heel-to-shin bilaterally, no dysmetria Reflexes: Brachioradialis 2/2, biceps 2/2, triceps 2/2, patellar 2/2, Achilles 2/2, plantar responses were flexor bilaterally. Gait and Station: Rising up from seated position without assistance, normal stance,  moderate stride, good arm swing, smooth turning, able to perform tiptoe, and heel walking without difficulty. Tandem gait is steady  DIAGNOSTIC DATA (LABS, IMAGING, TESTING) - ASSESSMENT AND PLAN  53 y.o. year old female  has a past medical history of  Cervical muscle strain; Lumbar strain; and headache here to follow up. She is doing well  May discontinue Mobic Continue Pamelor at night Continue neck exercises at least 2 times daily F/U 6 to 8 months Dennie Bible, [facility], [facility], APRN  [facility] [address] [place], [place] [address] [phone]

## 2014-02-17 NOTE — Progress Notes (Signed)
I have read the note, and I agree with the clinical assessment and plan.  [name] KEITH   

## 2014-02-17 NOTE — Patient Instructions (Signed)
May discontinue Mobic Continue Pamelor at night Continue neck exercises at least 2 times daily F/U 6 to 8 months

## 2014-05-30 ENCOUNTER — Telehealth: Payer: Self-pay | Admitting: Family Medicine

## 2014-05-30 NOTE — Telephone Encounter (Signed)
appt given for thurs

## 2014-06-01 ENCOUNTER — Ambulatory Visit (INDEPENDENT_AMBULATORY_CARE_PROVIDER_SITE_OTHER): Payer: BC Managed Care – PPO

## 2014-06-01 ENCOUNTER — Ambulatory Visit (INDEPENDENT_AMBULATORY_CARE_PROVIDER_SITE_OTHER): Payer: BC Managed Care – PPO | Admitting: Nurse Practitioner

## 2014-06-01 ENCOUNTER — Encounter: Payer: Self-pay | Admitting: Nurse Practitioner

## 2014-06-01 VITALS — BP 117/76 | HR 100 | Temp 98.0°F | Ht 68.0 in | Wt 114.6 lb

## 2014-06-01 DIAGNOSIS — M25569 Pain in unspecified knee: Secondary | ICD-10-CM

## 2014-06-01 DIAGNOSIS — M545 Low back pain, unspecified: Secondary | ICD-10-CM

## 2014-06-01 DIAGNOSIS — M25561 Pain in right knee: Secondary | ICD-10-CM

## 2014-06-01 MED ORDER — MELOXICAM 15 MG PO TABS: 15.0000 mg | ORAL_TABLET | Freq: Every day | ORAL | Status: DC

## 2014-06-01 MED ORDER — CYCLOBENZAPRINE HCL 5 MG PO TABS: 5.0000 mg | ORAL_TABLET | Freq: Three times a day (TID) | ORAL | Status: DC | PRN

## 2014-06-01 NOTE — Progress Notes (Signed)
   Subjective:    Patient ID: [name], female    DOB: 11-11-1961, 53 y.o.   MRN: [id number]  Back Pain This is a recurrent problem. The current episode started more than 1 month ago. The problem occurs intermittently. The problem has been gradually worsening since onset. The pain is present in the sacro-iliac. The pain does not radiate. The pain is moderate. The pain is worse during the day. The symptoms are aggravated by sitting. She has tried home exercises for the symptoms. The treatment provided mild relief.  Knee Pain  The incident occurred more than 1 week ago. There was no injury mechanism. The pain is present in the right knee. The pain is at a severity of 6/10. The pain is moderate. The pain has been intermittent since onset. She reports no foreign bodies present. The symptoms are aggravated by movement. She has tried heat and NSAIDs for the symptoms. The treatment provided mild relief.      Review of Systems  Constitutional: Negative.   HENT: Negative.   Eyes: Negative.   Respiratory: Negative.   Cardiovascular: Negative.   Gastrointestinal: Negative.   Endocrine: Negative.   Genitourinary: Negative.   Musculoskeletal: Positive for back pain.  Skin: Negative.   Allergic/Immunologic: Negative.   Neurological: Negative.   Hematological: Negative.   Psychiatric/Behavioral: Negative.        Objective:   Physical Exam  Constitutional: She is oriented to person, place, and time. She appears well-developed.  HENT:  Head: Normocephalic.  Eyes: Pupils are equal, round, and reactive to light.  Neck: Normal range of motion.  Cardiovascular: Normal rate.   Pulmonary/Chest: Effort normal.  Abdominal: Soft.  Musculoskeletal:  FROM of right knee with crepitus  On flexion and extension- no effusion- no patella tenderness  FROM of lumbar spine with pain on flexion a nd extension Neg SLR bil  Neurological: She is alert and oriented to person, place, and time. She has normal  reflexes.  Skin: Skin is warm.    BP 117/76  Pulse 100  Temp(Src) 98 F (36.7 C) (Oral)  Ht 5\' 8"  (1.727 m)  Wt 114 lb 9.6 oz (51.982 kg)  BMI 17.43 kg/m2  Right knee- degenerative changes-Preliminary reading by Ronnald Collum, FNP  [facility] Lumbar x ray- lumbar curvature-Preliminary reading by Ronnald Collum, FNP  [facility]      Assessment & Plan:   1. Knee pain, acute, right   2. Midline low back pain without sciatica    Meds ordered this encounter  Medications  . meloxicam (MOBIC) 15 MG tablet    Sig: Take 1 tablet (15 mg total) by mouth daily.    Dispense:  30 tablet    Refill:  2    Order Specific Question:  Supervising Provider    Answer:  Chipper Herb [[id number]]  . cyclobenzaprine (FLEXERIL) 5 MG tablet    Sig: Take 1 tablet (5 mg total) by mouth 3 (three) times daily as needed for muscle spasms.    Dispense:  30 tablet    Refill:  1    Order Specific Question:  Supervising Provider    Answer:  Chipper Herb [[id number]]   Moist heat to knee and back Rest when you can No heavy lifting  Mary-Margaret Hassell Done, FNP

## 2014-06-01 NOTE — Patient Instructions (Signed)

## 2014-09-20 ENCOUNTER — Ambulatory Visit (INDEPENDENT_AMBULATORY_CARE_PROVIDER_SITE_OTHER): Payer: BC Managed Care – PPO | Admitting: Adult Health

## 2014-09-20 ENCOUNTER — Encounter: Payer: Self-pay | Admitting: Adult Health

## 2014-09-20 VITALS — BP 107/71 | HR 91 | Ht 68.0 in | Wt 117.8 lb

## 2014-09-20 DIAGNOSIS — R51 Headache: Secondary | ICD-10-CM

## 2014-09-20 DIAGNOSIS — S139XXD Sprain of joints and ligaments of unspecified parts of neck, subsequent encounter: Secondary | ICD-10-CM

## 2014-09-20 DIAGNOSIS — R519 Headache, unspecified: Secondary | ICD-10-CM

## 2014-09-20 MED ORDER — NORTRIPTYLINE HCL 10 MG PO CAPS: 10.0000 mg | ORAL_CAPSULE | Freq: Every day | ORAL | Status: DC

## 2014-09-20 NOTE — Patient Instructions (Signed)

## 2014-09-20 NOTE — Progress Notes (Signed)
I have read the note, and I agree with the clinical assessment and plan.  WILLIS,CHARLES KEITH   

## 2014-09-20 NOTE — Progress Notes (Signed)
PATIENT: [name] DOB: 05-14-1961  REASON FOR VISIT: follow up HISTORY FROM: patient  HISTORY OF PRESENT ILLNESS: [name] is a 53 year old female with a history of neck pain. She returns today for follow-up. She is currently taking nortriptyline. She reports that her neck pain has improved. She states that she can now turn her head whereas before she could not turn it to the left.  Patient states that she was on Mobic for back pain recently and has since stopped. She states that she feels that the mobic was helpful for her back and may have to restart it. No new medical issues since last seen.   HISTORY 02/17/14 (CM): [name] returns for followup. In terms of her neck pain she is doing better and wants to get off of her Mobic. She intermittently does her exercises which have been beneficial. Pamelor has been good for her headaches. She returns for reevaluation   REVIEW OF SYSTEMS: Full 14 system review of systems performed and notable only for:  Constitutional: N/A  Eyes: N/A Ear/Nose/Throat: N/A  Skin: N/A  Cardiovascular: N/A  Respiratory: N/A  Gastrointestinal: N/A  Genitourinary: N/A Hematology/Lymphatic: N/A  Endocrine: N/A Musculoskeletal:N/A  Allergy/Immunology: N/A  Neurological: N/A Psychiatric: N/A Sleep: N/A   ALLERGIES: Allergies  Allergen Reactions  . Sulfa Antibiotics Other (See Comments)    Makes mouth raw  . Codeine Nausea And Vomiting    HOME MEDICATIONS: Outpatient Prescriptions Prior to Visit  Medication Sig Dispense Refill  . ALPRAZolam (XANAX) 0.25 MG tablet Take 0.25 mg by mouth 2 (two) times daily as needed. Take 2 a day      . Calcium Carbonate-Vitamin D (CALCIUM + D PO) Take by mouth.        . levothyroxine (SYNTHROID, LEVOTHROID) 88 MCG tablet Take 88 mcg by mouth daily before breakfast.      . Multiple Vitamin (MULTIVITAMIN) capsule Take 1 capsule by mouth daily.        . nortriptyline (PAMELOR) 10 MG capsule Take 1 capsule (10 mg  total) by mouth 2 (two) times daily.  60 capsule  6  . venlafaxine (EFFEXOR) 37.5 MG tablet Take 37.5 mg by mouth daily.      . cyclobenzaprine (FLEXERIL) 5 MG tablet Take 1 tablet (5 mg total) by mouth 3 (three) times daily as needed for muscle spasms.  30 tablet  1  . meloxicam (MOBIC) 15 MG tablet Take 1 tablet (15 mg total) by mouth daily.  30 tablet  2   No facility-administered medications prior to visit.    PAST MEDICAL HISTORY: Past Medical History  Diagnosis Date  . Chronic kidney disease   . Thyroid disease   . Headache(784.0)   . Cervical muscle strain   . Lumbar strain   . Cancer 2012    Breast    PAST SURGICAL HISTORY: Past Surgical History  Procedure Laterality Date  . Breast surgery  04/10/2011    Right mastectomy and slnbx    FAMILY HISTORY: Family History  Problem Relation Age of Onset  . Stroke Mother   . Hypertension Mother   . Heart disease Mother   . Cancer Maternal Grandmother     Breast cancer    SOCIAL HISTORY: History   Social History  . Marital Status: Divorced    Spouse Name: N/A    Number of Children: 1  . Years of Education: HS   Occupational History  .      [employer]   Social History Main Topics  . Smoking status: Former Smoker -- 0.50 packs/day for 30 years    Types: Cigarettes  . Smokeless tobacco: Never Used  . Alcohol Use: 1.2 oz/week    2 Cans of beer per week     Comment: 1-2 DRINKS WEEKLY  . Drug Use: No  . Sexual Activity: Not on file   Other Topics Concern  . Not on file   Social History Narrative   Patient lives at home with her boyfriend and daughter. Patient works Full time.    High school education.   Right handed.   Caffeine- two cups daily- Rowe.      PHYSICAL EXAM  Filed Vitals:   09/20/14 1046  BP: 107/71  Pulse: 91  Height: $Remove'5\' 8"'OJmlXzi$  (1.727 m)  Weight: 117 lb 12.8 oz (53.434 kg)   Body mass index is 17.92 kg/(m^2).  Generalized: Well developed, in no acute distress   Neurological  examination  Mentation: Alert oriented to time, place, history taking. Follows all commands speech and language fluent Cranial nerve II-XII: Pupils were equal round reactive to light. Extraocular movements were full, visual field were full on confrontational test. Facial sensation and strength were normal.Uvula tongue midline. Head turning and shoulder shrug  were normal and symmetric. Motor: The motor testing reveals 5 over 5 strength of all 4 extremities. Good symmetric motor tone is noted throughout. No pain with flexion, extension, abduction or adduction of the neck. Sensory: Sensory testing is intact to soft touch on all 4 extremities. No evidence of extinction is noted.  Coordination: Cerebellar testing reveals good finger-nose-finger and heel-to-shin bilaterally.  Gait and station: Gait is normal. Tandem gait is normal. Romberg is negative. No drift is seen.  Reflexes: Deep tendon reflexes are symmetric and normal bilaterally.    DIAGNOSTIC DATA (LABS, IMAGING, TESTING) - I reviewed patient records, labs, notes, testing and imaging myself where available.  Lab Results  Component Value Date   WBC 4.9 04/07/2011   HGB 13.9 04/07/2011   HCT 39.9 04/07/2011   MCV 91.5 04/07/2011   PLT 230 04/07/2011      Component Value Date/Time   NA 141 04/07/2011 1008   K 4.2 04/07/2011 1008   CL 102 04/07/2011 1008   CO2 32 04/07/2011 1008   GLUCOSE 101* 04/07/2011 1008   BUN 7 04/07/2011 1008   CREATININE 0.65 04/07/2011 1008   CALCIUM 10.2 04/07/2011 1008   GFRNONAA >60 04/07/2011 1008   GFRAA  Value: >60        The eGFR has been calculated using the MDRD equation. This calculation has not been validated in all clinical situations. eGFR's persistently <60 mL/min signify possible Chronic Kidney Disease. 04/07/2011 1008       ASSESSMENT AND PLAN 53 y.o. year old female  has a past medical history of Chronic kidney disease; Thyroid disease; Headache(784.0); Cervical muscle strain; Lumbar strain; and  Cancer (2012). here with;  1. Neck pain  Neck pain has improved with nortriptyline Continue nortriptyline, I will refill today.  F/U with PCP regarding back pain. Follow-up in 1 year or sooner if needed.   Ward Givens, MSN, NP-C 09/20/2014, 11:19 AM [facility] [address] [place], [place] [address] [phone]  Note: This document was prepared with digital dictation and possible smart phrase technology. Any transcriptional errors that result from this process are unintentional.

## 2014-10-04 ENCOUNTER — Other Ambulatory Visit: Payer: Self-pay | Admitting: Internal Medicine

## 2014-10-04 DIAGNOSIS — R921 Mammographic calcification found on diagnostic imaging of breast: Secondary | ICD-10-CM

## 2014-10-13 ENCOUNTER — Ambulatory Visit
Admission: RE | Admit: 2014-10-13 | Discharge: 2014-10-13 | Disposition: A | Discharge status: Home / Self Care | Payer: BC Managed Care – PPO | Source: Ambulatory Visit | Attending: Internal Medicine | Admitting: Internal Medicine

## 2014-10-13 DIAGNOSIS — R921 Mammographic calcification found on diagnostic imaging of breast: Secondary | ICD-10-CM

## 2014-12-27 ENCOUNTER — Other Ambulatory Visit (INDEPENDENT_AMBULATORY_CARE_PROVIDER_SITE_OTHER): Payer: Self-pay | Admitting: Surgery

## 2014-12-27 DIAGNOSIS — R920 Mammographic microcalcification found on diagnostic imaging of breast: Secondary | ICD-10-CM

## 2015-01-02 ENCOUNTER — Other Ambulatory Visit (INDEPENDENT_AMBULATORY_CARE_PROVIDER_SITE_OTHER): Payer: Self-pay | Admitting: Surgery

## 2015-01-02 DIAGNOSIS — R920 Mammographic microcalcification found on diagnostic imaging of breast: Secondary | ICD-10-CM

## 2015-01-10 ENCOUNTER — Encounter (HOSPITAL_BASED_OUTPATIENT_CLINIC_OR_DEPARTMENT_OTHER): Payer: Self-pay | Admitting: *Deleted

## 2015-01-10 NOTE — Progress Notes (Signed)
No labs needed

## 2015-01-15 ENCOUNTER — Ambulatory Visit
Admission: RE | Admit: 2015-01-15 | Discharge: 2015-01-15 | Disposition: A | Discharge status: Home / Self Care | Payer: BLUE CROSS/BLUE SHIELD | Source: Ambulatory Visit | Attending: Surgery | Admitting: Surgery

## 2015-01-15 DIAGNOSIS — R920 Mammographic microcalcification found on diagnostic imaging of breast: Secondary | ICD-10-CM

## 2015-01-15 LAB — HM MAMMOGRAPHY

## 2015-01-15 NOTE — H&P (Signed)
[name] 11/15/2014 1:20 PM Location: [facility] Patient #: [phone] DOB: 03-12-1961 Divorced / Language: [name] / Race: White Female  History of Present Illness: Patient words: f/u breast.  The patient is a 54 year old female who presents with a complaint of breast problems. Her PCP is Dr. Shelly Bombard and D. Moore. She comes by herself.  I last saw [name] on 02/10/2013. She has been doing well. But she has had a series of recent mammograms. She had mammograms in [place] in Oct 2015. This prompted more mammograms on 10/04/2014. They recommended a stereotactic biopsy and she went to [facility]. They don't have a record of seeing her. Now she is here. When I first saw her in the office, I had no records. She is doing well from her original breast cancer. She has quit smoking about 1 year ago. She looks good.  Faxed records: Mammogram 10/04/2014 - suspicious left brast calcifications in th UOQ. The area is 4 mm. Read by Shon Hale.  History of her right breast cancer: Right breast cancer, triple negative. (T1c,N0) Measured 1.7 cm prior to treatment. Neoadjuvant chemotx by Dr. Sonny Dandy. She is to see Dr. Vaughan Basta in follow up. CR at mastectomy on 04/10/2011. Disease free.  Past Medical History: 1. Whiplash - from [place] - 12/01/2011 - this is all better 2. Quit smoking cigarettes about one year ago.  Social History: She works at [employer]. Her daughter bugs her about quitting smoking.  Addendum Note(Frida Wahlstrom H. Lucia Gaskins MD; 12/27/2014 1:50 PM) By phone. Ms. [name] was sick (sinus) and then her daughter was sick. But she is now ready for the breast biopsy. I talked to Dr. Owens Shark who said a "seed" loc would be good. DN 12/27/2014  Other Problems (Ammie Eversole, LPN; 44/81/8563 1:49 PM) Breast Cancer Lump In Breast Thyroid Disease  Past Surgical History (Ammie Eversole, LPN; 70/26/3785 8:85 PM) Breast Biopsy Right. Breast Mass; Local Excision  Right. Cesarean Section - 1 Mastectomy Right. Oral Surgery  Diagnostic Studies History (Ammie Eversole, LPN; 02/77/4128 7:86 PM) Colonoscopy never Mammogram within last year Pap Smear 1-5 years ago  Allergies (Ammie Eversole, LPN; 76/72/0947 0:96 PM) Codeine Phosphate *ANALGESICS - OPIOID* Sulfa Antibiotics  Medication History (Ammie Eversole, LPN; 28/36/6294 7:65 PM) Xanax (0.25MG  Tablet, Oral) Active. Calcium Carbonate Antacid (500MG  Tablet Chewable, Oral) Active. Flexeril (5MG  Tablet, Oral) Active. Levothyroxine Sodium (88MCG Tablet, Oral) Active. Multivitamin Adults 50+ (Oral) Active. Pamelor (10MG  Capsule, Oral) Active. Effexor XR (37.5MG  Capsule ER 24HR, Oral) Active.  Social History (Ammie Eversole, LPN; 46/50/3546 5:68 PM) Alcohol use Occasional alcohol use. Caffeine use Carbonated beverages, Rowe. No drug use Tobacco use Former smoker.  Family History (Ammie Eversole, LPN; 12/75/1700 1:74 PM) Breast Cancer Family Members In General. Cerebrovascular Accident Mother. Heart disease in female family member before age 50 Hypertension Mother. Kidney Disease Father.  Pregnancy / Birth History Susan Borer, LPN; 94/49/6759 1:63 PM) Age at menarche 41 years. Age of menopause 42-50 Gravida 1 Irregular periods Maternal age 10-40 Para 1  Review of Systems (Ammie Eversole LPN; 84/66/5993 5:70 PM) General Not Present- Appetite Loss, Chills, Fatigue, Fever, Night Sweats, Weight Gain and Weight Loss. Skin Not Present- Change in Wart/Mole, Dryness, Hives, Jaundice, New Lesions, Non-Healing Wounds, Rash and Ulcer. HEENT Not Present- Earache, Hearing Loss, Hoarseness, Nose Bleed, Oral Ulcers, Ringing in the Ears, Seasonal Allergies, Sinus Pain, Sore Throat, Visual Disturbances, Wears glasses/contact lenses and Yellow Eyes. Respiratory Not Present- Bloody sputum, Chronic Cough, Difficulty Breathing, Snoring and Wheezing. Breast Present- Breast  Mass. Not Present- Breast Pain, Nipple Discharge and Skin Changes. Cardiovascular Not Present- Chest Pain, Difficulty Breathing Lying Down, Leg Cramps, Palpitations, Rapid Heart Rate, Shortness of Breath and Swelling of Extremities. Gastrointestinal Not Present- Abdominal Pain, Bloating, Bloody Stool, Change in Bowel Habits, Chronic diarrhea, Constipation, Difficulty Swallowing, Excessive gas, Gets full quickly at meals, Hemorrhoids, Indigestion, Nausea, Rectal Pain and Vomiting. Female Genitourinary Not Present- Frequency, Nocturia, Painful Urination, Pelvic Pain and Urgency. Musculoskeletal Not Present- Back Pain, Joint Pain, Joint Stiffness, Muscle Pain, Muscle Weakness and Swelling of Extremities. Neurological Not Present- Decreased Memory, Fainting, Headaches, Numbness, Seizures, Tingling, Tremor, Trouble walking and Weakness. Psychiatric Not Present- Anxiety, Bipolar, Change in Sleep Pattern, Depression, Fearful and Frequent crying. Endocrine Not Present- Cold Intolerance, Excessive Hunger, Hair Changes, Heat Intolerance, Hot flashes and New Diabetes. Hematology Not Present- Easy Bruising, Excessive bleeding, Gland problems, HIV and Persistent Infections.   Vitals (Ammie Eversole LPN; 33/00/7622 6:33 PM) 11/15/2014 1:21 PM Weight: 118.6 lb Height: 68in Body Surface Area: 1.61 m Body Mass Index: 18.03 kg/m Temp.: 98.30F(Oral)  Pulse: 97 (Regular)  Resp.: 16 (Unlabored)  BP: 130/82 (Sitting, Left Arm, Standard)   Physical Exam: The physical exam findings are as follows: Note:HEENT: Pupils equal. NECK: Supple. No thyroid mass.  LYMPH NODES: No cervical, supraclavicular, or axillary adenopathy.  Lungs: Clear Heart: RRR  BREASTS - RIGHT: Absent. No mass or nodule.  LEFT: No palpable mass or nodule. No nipple discharge. Nothing that I see correlates with the mammogam findings.  UPPER EXTREMITIES: No evidence of lymphedema.  Assessment & Plan: 1.  MAMMOGRAPHIC  MICROCALCIFICATION (793.81  R92.0) Story: Left breast Impression: Mammogram 10/04/2014 - suspicious left brast calcifications in th UOQ. The area is 4 mm. Read by Shon Hale.  I need to talk to [facility] to see if she is a candidate for core biopsy or how to mark the area (seed vs wire) to excise. Current Plans  Patient or caregiver to follow up by phone with update next 1-2 weeks.   By phone. Ms. [name] was sick (sinus) and then her daughter was sick. But she is now ready for the breast biopsy. I talked to Dr. Owens Shark who said a "seed" loc would be good.  2.  HISTORY OF RIGHT BREAST CANCER (V10.3  Z85.3) Story: Right breast cancer, triple negative. (T1c,N0) Measured 1.7 cm prior to treatment. Neoadjuvant chemotx by Dr. Sonny Dandy. She sees Dr. Vaughan Basta in follow up. CR at mastectomy on 04/10/2011.  Alphonsa Overall, MD, [facility] Pager: [phone] Office phone:  [phone]

## 2015-01-16 ENCOUNTER — Ambulatory Visit (HOSPITAL_BASED_OUTPATIENT_CLINIC_OR_DEPARTMENT_OTHER)
Admission: RE | Admit: 2015-01-16 | Discharge: 2015-01-16 | Disposition: A | Discharge status: Home / Self Care | Payer: BLUE CROSS/BLUE SHIELD | Source: Ambulatory Visit | Attending: Surgery | Admitting: Surgery

## 2015-01-16 ENCOUNTER — Ambulatory Visit (HOSPITAL_BASED_OUTPATIENT_CLINIC_OR_DEPARTMENT_OTHER): Payer: BLUE CROSS/BLUE SHIELD | Admitting: Anesthesiology

## 2015-01-16 ENCOUNTER — Encounter (HOSPITAL_BASED_OUTPATIENT_CLINIC_OR_DEPARTMENT_OTHER)
Admission: RE | Disposition: A | Discharge status: Home / Self Care | Payer: Self-pay | Source: Ambulatory Visit | Attending: Surgery

## 2015-01-16 ENCOUNTER — Ambulatory Visit
Admission: RE | Admit: 2015-01-16 | Discharge: 2015-01-16 | Disposition: A | Discharge status: Home / Self Care | Payer: BLUE CROSS/BLUE SHIELD | Source: Ambulatory Visit | Attending: Surgery | Admitting: Surgery

## 2015-01-16 ENCOUNTER — Encounter (HOSPITAL_BASED_OUTPATIENT_CLINIC_OR_DEPARTMENT_OTHER): Payer: Self-pay | Admitting: Anesthesiology

## 2015-01-16 DIAGNOSIS — Z79899 Other long term (current) drug therapy: Secondary | ICD-10-CM | POA: Diagnosis not present

## 2015-01-16 DIAGNOSIS — Z9011 Acquired absence of right breast and nipple: Secondary | ICD-10-CM | POA: Insufficient documentation

## 2015-01-16 DIAGNOSIS — Z853 Personal history of malignant neoplasm of breast: Secondary | ICD-10-CM | POA: Insufficient documentation

## 2015-01-16 DIAGNOSIS — N189 Chronic kidney disease, unspecified: Secondary | ICD-10-CM | POA: Insufficient documentation

## 2015-01-16 DIAGNOSIS — E079 Disorder of thyroid, unspecified: Secondary | ICD-10-CM | POA: Diagnosis not present

## 2015-01-16 DIAGNOSIS — Z87891 Personal history of nicotine dependence: Secondary | ICD-10-CM | POA: Diagnosis not present

## 2015-01-16 DIAGNOSIS — R920 Mammographic microcalcification found on diagnostic imaging of breast: Secondary | ICD-10-CM | POA: Insufficient documentation

## 2015-01-16 DIAGNOSIS — Z9221 Personal history of antineoplastic chemotherapy: Secondary | ICD-10-CM | POA: Insufficient documentation

## 2015-01-16 DIAGNOSIS — Z803 Family history of malignant neoplasm of breast: Secondary | ICD-10-CM | POA: Diagnosis not present

## 2015-01-16 HISTORY — DX: Presence of dental prosthetic device (complete) (partial): Z97.2

## 2015-01-16 HISTORY — PX: RADIOACTIVE SEED GUIDED EXCISIONAL BREAST BIOPSY: SHX6490

## 2015-01-16 SURGERY — RADIOACTIVE SEED GUIDED BREAST BIOPSY
Anesthesia: General | Laterality: Left

## 2015-01-16 MED ORDER — MIDAZOLAM HCL 5 MG/5ML IJ SOLN
INTRAMUSCULAR | Status: DC | PRN
  Administered 2015-01-16: 2 mg via INTRAVENOUS

## 2015-01-16 MED ORDER — ONDANSETRON HCL 4 MG/2ML IJ SOLN
INTRAMUSCULAR | Status: DC | PRN
  Administered 2015-01-16: 4 mg via INTRAVENOUS

## 2015-01-16 MED ORDER — HYDROCODONE-ACETAMINOPHEN 5-325 MG PO TABS: 1.0000 | ORAL_TABLET | Freq: Four times a day (QID) | ORAL | Status: DC | PRN

## 2015-01-16 MED ORDER — BUPIVACAINE-EPINEPHRINE (PF) 0.25% -1:200000 IJ SOLN
INTRAMUSCULAR | Status: AC
  Filled 2015-01-16: qty 30

## 2015-01-16 MED ORDER — LIDOCAINE HCL (CARDIAC) 20 MG/ML IV SOLN
INTRAVENOUS | Status: DC | PRN
  Administered 2015-01-16: 100 mg via INTRAVENOUS

## 2015-01-16 MED ORDER — LACTATED RINGERS IV SOLN
INTRAVENOUS | Status: DC
  Administered 2015-01-16: 07:00:00 via INTRAVENOUS

## 2015-01-16 MED ORDER — FENTANYL CITRATE 0.05 MG/ML IJ SOLN: 50.0000 ug | INTRAMUSCULAR | Status: DC | PRN

## 2015-01-16 MED ORDER — CHLORHEXIDINE GLUCONATE 4 % EX LIQD: 1.0000 "application " | Freq: Once | CUTANEOUS | Status: DC

## 2015-01-16 MED ORDER — BUPIVACAINE-EPINEPHRINE (PF) 0.5% -1:200000 IJ SOLN
INTRAMUSCULAR | Status: AC
  Filled 2015-01-16: qty 30

## 2015-01-16 MED ORDER — 0.9 % SODIUM CHLORIDE (POUR BTL) OPTIME
TOPICAL | Status: DC | PRN
  Administered 2015-01-16: 200 mL

## 2015-01-16 MED ORDER — FENTANYL CITRATE 0.05 MG/ML IJ SOLN
INTRAMUSCULAR | Status: DC | PRN
  Administered 2015-01-16: 100 ug via INTRAVENOUS

## 2015-01-16 MED ORDER — MIDAZOLAM HCL 2 MG/2ML IJ SOLN: 1.0000 mg | INTRAMUSCULAR | Status: DC | PRN

## 2015-01-16 MED ORDER — PROPOFOL 10 MG/ML IV BOLUS
INTRAVENOUS | Status: DC | PRN
  Administered 2015-01-16: 150 mg via INTRAVENOUS

## 2015-01-16 MED ORDER — MIDAZOLAM HCL 2 MG/2ML IJ SOLN
INTRAMUSCULAR | Status: AC
  Filled 2015-01-16: qty 2

## 2015-01-16 MED ORDER — DEXAMETHASONE SODIUM PHOSPHATE 4 MG/ML IJ SOLN
INTRAMUSCULAR | Status: DC | PRN
  Administered 2015-01-16: 10 mg via INTRAVENOUS

## 2015-01-16 MED ORDER — BUPIVACAINE-EPINEPHRINE (PF) 0.5% -1:200000 IJ SOLN
INTRAMUSCULAR | Status: DC | PRN
  Administered 2015-01-16: 18 mL

## 2015-01-16 MED ORDER — CEFAZOLIN SODIUM-DEXTROSE 2-3 GM-% IV SOLR
INTRAVENOUS | Status: AC
  Filled 2015-01-16: qty 50

## 2015-01-16 MED ORDER — FENTANYL CITRATE 0.05 MG/ML IJ SOLN
INTRAMUSCULAR | Status: AC
  Filled 2015-01-16: qty 4

## 2015-01-16 SURGICAL SUPPLY — 54 items
APL SKNCLS STERI-STRIP NONHPOA (GAUZE/BANDAGES/DRESSINGS)
BENZOIN TINCTURE PRP APPL 2/3 (GAUZE/BANDAGES/DRESSINGS) IMPLANT
BINDER BREAST LRG (GAUZE/BANDAGES/DRESSINGS) IMPLANT
BINDER BREAST MEDIUM (GAUZE/BANDAGES/DRESSINGS) ×1 IMPLANT
BINDER BREAST XLRG (GAUZE/BANDAGES/DRESSINGS) IMPLANT
BINDER BREAST XXLRG (GAUZE/BANDAGES/DRESSINGS) IMPLANT
BLADE HEX COATED 2.75 (ELECTRODE) IMPLANT
BLADE SURG 10 STRL SS (BLADE) ×1 IMPLANT
BLADE SURG 15 STRL LF DISP TIS (BLADE) ×1 IMPLANT
BLADE SURG 15 STRL SS (BLADE) ×2
CANISTER SUC SOCK COL 7IN (MISCELLANEOUS) IMPLANT
CANISTER SUCT 1200ML W/VALVE (MISCELLANEOUS) ×2 IMPLANT
CHLORAPREP W/TINT 26ML (MISCELLANEOUS) ×2 IMPLANT
CLIP TI WIDE RED SMALL 6 (CLIP) IMPLANT
COVER BACK TABLE 60X90IN (DRAPES) ×2 IMPLANT
COVER MAYO STAND STRL (DRAPES) ×2 IMPLANT
COVER PROBE W GEL 5X96 (DRAPES) ×2 IMPLANT
DECANTER SPIKE VIAL GLASS SM (MISCELLANEOUS) IMPLANT
DEVICE DUBIN W/COMP PLATE 8390 (MISCELLANEOUS) ×2 IMPLANT
DRAPE LAPAROTOMY 100X72 PEDS (DRAPES) ×2 IMPLANT
DRAPE UTILITY XL STRL (DRAPES) ×2 IMPLANT
DRSG PAD ABDOMINAL 8X10 ST (GAUZE/BANDAGES/DRESSINGS) IMPLANT
ELECT COATED BLADE 2.86 ST (ELECTRODE) ×2 IMPLANT
ELECT REM PT RETURN 9FT ADLT (ELECTROSURGICAL) ×2
ELECTRODE REM PT RTRN 9FT ADLT (ELECTROSURGICAL) ×1 IMPLANT
GLOVE BIOGEL PI IND STRL 7.0 (GLOVE) IMPLANT
GLOVE BIOGEL PI INDICATOR 7.0 (GLOVE) ×1
GLOVE ECLIPSE 6.5 STRL STRAW (GLOVE) ×1 IMPLANT
GLOVE EXAM NITRILE LRG STRL (GLOVE) ×1 IMPLANT
GLOVE SURG SIGNA 7.5 PF LTX (GLOVE) ×3 IMPLANT
GOWN STRL REUS W/ TWL LRG LVL3 (GOWN DISPOSABLE) ×1 IMPLANT
GOWN STRL REUS W/ TWL XL LVL3 (GOWN DISPOSABLE) ×1 IMPLANT
GOWN STRL REUS W/TWL LRG LVL3 (GOWN DISPOSABLE) ×2
GOWN STRL REUS W/TWL XL LVL3 (GOWN DISPOSABLE) ×2
KIT MARKER MARGIN INK (KITS) ×2 IMPLANT
LIQUID BAND (GAUZE/BANDAGES/DRESSINGS) ×2 IMPLANT
NDL HYPO 25X1 1.5 SAFETY (NEEDLE) ×1 IMPLANT
NEEDLE HYPO 25X1 1.5 SAFETY (NEEDLE) ×2 IMPLANT
NS IRRIG 1000ML POUR BTL (IV SOLUTION) ×1 IMPLANT
PACK BASIN DAY SURGERY FS (CUSTOM PROCEDURE TRAY) ×2 IMPLANT
PENCIL BUTTON HOLSTER BLD 10FT (ELECTRODE) ×2 IMPLANT
PIN SAFETY STERILE (MISCELLANEOUS) IMPLANT
SHEET MEDIUM DRAPE 40X70 STRL (DRAPES) ×2 IMPLANT
SLEEVE SCD COMPRESS KNEE MED (MISCELLANEOUS) ×2 IMPLANT
SPONGE GAUZE 4X4 12PLY STER LF (GAUZE/BANDAGES/DRESSINGS) IMPLANT
SPONGE LAP 18X18 X RAY DECT (DISPOSABLE) ×2 IMPLANT
STRIP CLOSURE SKIN 1/4X4 (GAUZE/BANDAGES/DRESSINGS) IMPLANT
SUT MON AB 5-0 PS2 18 (SUTURE) ×2 IMPLANT
SUT VICRYL 3-0 CR8 SH (SUTURE) ×2 IMPLANT
SYR CONTROL 10ML LL (SYRINGE) ×2 IMPLANT
TOWEL OR 17X24 6PK STRL BLUE (TOWEL DISPOSABLE) ×2 IMPLANT
TOWEL OR NON WOVEN STRL DISP B (DISPOSABLE) ×2 IMPLANT
TUBE CONNECTING 20X1/4 (TUBING) ×2 IMPLANT
YANKAUER SUCT BULB TIP NO VENT (SUCTIONS) ×2 IMPLANT

## 2015-01-16 NOTE — Discharge Instructions (Signed)
°  Post Anesthesia Home Care Instructions  Activity: Get plenty of rest for the remainder of the day. A responsible adult should stay with you for 24 hours following the procedure.  For the next 24 hours, DO NOT: -Drive a car -Paediatric nurse -Drink alcoholic beverages -Take any medication unless instructed by your physician -Make any legal decisions or sign important papers.  Meals: Start with liquid foods such as gelatin or soup. Progress to regular foods as tolerated. Avoid greasy, spicy, heavy foods. If nausea and/or vomiting occur, drink only clear liquids until the nausea and/or vomiting subsides. Call your physician if vomiting continues.  Special Instructions/Symptoms: Your throat may feel dry or sore from the anesthesia or the breathing tube placed in your throat during surgery. If this causes discomfort, gargle with warm salt water. The discomfort should disappear within 24 hours.  [facility] - DISCHARGE INSTRUCTIONS TO PATIENT  Return to work on:  01/22/2015  Activity:  Driving - May drive in one or two days, if doing well   Lifting - Take it [place] for 3 or 4 days, then no limit  Wound Care:   Leave incision dry for 48 hours, then may shower  Diet:  As tolerated  Follow up appointment:  You have an appointment.  Call Dr. Pollie Friar office [facility]) at [phone] for any question.  Medications and dosages:  Resume your home medications.  You have a prescription for:  Vicodin  Call Dr. Lucia Gaskins or his office  [phone]) if you have:  Temperature greater than 100.4,  Persistent nausea and vomiting,  Severe uncontrolled pain,  Redness, tenderness, or signs of infection (pain, swelling, redness, odor or green/yellow discharge around the site),  Difficulty breathing, headache or visual disturbances,  Any other questions or concerns you may have after discharge.  In an emergency, call 911 or go to an Emergency Department at a nearby  hospital.

## 2015-01-16 NOTE — Op Note (Signed)
01/16/2015  8:45 AM  PATIENT:  [name] DOB: 05-20-1961 MRN: [id number]  PREOP DIAGNOSIS:  Microcalcifications of the left breast  POSTOP DIAGNOSIS:   Microcalcifications of the left breast, 12 o'clock  PROCEDURE:   Procedure(s):  RADIOACTIVE SEED GUIDED EXCISIONAL BREAST BIOPSY  SURGEON:   Alphonsa Overall, M.D.  ANESTHESIA:   general  Anesthesiologist: Lillia Abed, MD CRNA: Maryella Shivers, CRNA  General  EBL:  minimal  ml  DRAINS: none   LOCAL MEDICATIONS USED:   18 cc 1/4 % marcaine  SPECIMEN:   Left breast lumpectomy  COUNTS CORRECT:  YES  INDICATIONS FOR PROCEDURE:  [name] is a 54 y.o. (DOB: [birth year]) white  female whose primary care physician is Chevis Pretty, FNP and comes for left breast lumpectomy.   She has a history of a right breast cancer treated with neoadjuvant chemotherapy and right mastectomy in May 2012.  Her final path report showed a CR on the right.     On a recent mammogram, she had microca++ in the upper left breast which were not amendable to core biopsy.  She comes for an excision biopsy of these calcifications.     The options for breast cancer treatment have been discussed with the patient. She elected to proceed with lumpectomy and axillary sentinel lymph node.     The indications and potential complications of surgery were explained to the patient. Potential complications include, but are not limited to, bleeding, infection, the need for further surgery, and nerve injury.     She had a I131 seed placed on 01/15/2015 in her left breast at [facility].  I confirmed the presence of the I131 seed in the pre op area using the Neoprobe.  The seed is in the 12 o'clock position of the left breast.     OPERATIVE NOTE:   The patient was taken to room # 2 at [facility] where she underwent a general anesthesia  supervised by Anesthesiologist: Lillia Abed, MD CRNA: Maryella Shivers, CRNA. Her left breast and axilla were prepped  with  ChloraPrep and sterilely draped.    A time-out and the surgical check list was reviewed.    The microcalcifications were at the 12 o'clock position of the left breast.   I used the Neoprobe to identify the I131 seed.  I tried to excise an area around the microca++ of at least 1 cm.    I excised this block of breast tissue approximately 3 cm by 4 cm  in diameter.   I painted the lumpectomy specimen with the 6 color paint kit and did a specimen mammogram which confirmed the microca++ and the seed were all in the right position in the specimen.  The specimen was sent to pathology who called back to confirm that they have the seed and the specimen.   I then irrigated the wound with saline. I infiltrated approximately 30 mL of 1% local between the incisions.  I placed 6 clips to mark biopsy cavity, at 12, 3, 6, and 9 o'clock. Two clips were placed on the pectoralis major.   I then closed all the wounds in layers using 3-0 Vicryl sutures for the deep layer. At the skin, I closed the incisions with a 5-0 Monocryl suture. The incisions were then painted with LiquiBand.  She had gauze placed over the wounds and placed in a breast binder.   The patient tolerated the procedure well, was transported to the recovery room in good  condition. Sponge and needle count were correct at the end of the case.   Final pathology is pending.   Edmund Rick, MD, FACS [facility] Pager: [phone] Office phone:  [phone]      

## 2015-01-16 NOTE — Anesthesia Preprocedure Evaluation (Signed)
Anesthesia Evaluation  Patient identified by MRN, date of birth, ID band Patient awake    Reviewed: Allergy & Precautions, NPO status , Patient's Chart, lab work & pertinent test results  Airway Mallampati: I  TM Distance: >3 FB Neck ROM: Full    Dental   Pulmonary former smoker,          Cardiovascular     Neuro/Psych    GI/Hepatic   Endo/Other    Renal/GU CRFRenal disease     Musculoskeletal   Abdominal   Peds  Hematology   Anesthesia Other Findings   Reproductive/Obstetrics                             Anesthesia Physical Anesthesia Plan  ASA: II  Anesthesia Plan: General   Post-op Pain Management:    Induction: Intravenous  Airway Management Planned: LMA  Additional Equipment:   Intra-op Plan:   Post-operative Plan: Extubation in OR  Informed Consent: I have reviewed the patients History and Physical, chart, labs and discussed the procedure including the risks, benefits and alternatives for the proposed anesthesia with the patient or authorized representative who has indicated his/her understanding and acceptance.     Plan Discussed with: CRNA and Surgeon  Anesthesia Plan Comments:         Anesthesia Quick Evaluation

## 2015-01-16 NOTE — Anesthesia Postprocedure Evaluation (Signed)
Anesthesia Post Note  Patient: [name]  Procedure(s) Performed: Procedure(s) (LRB): RADIOACTIVE SEED GUIDED EXCISIONAL BREAST BIOPSY (Left)  Anesthesia type: general  Patient location: PACU  Post pain: Pain level controlled  Post assessment: Patient's Cardiovascular Status Stable  Last Vitals:  Filed Vitals:   01/16/15 0920  BP: 143/78  Pulse: 88  Temp: 36.4 C  Resp: 20    Post vital signs: Reviewed and stable  Level of consciousness: sedated  Complications: No apparent anesthesia complications

## 2015-01-16 NOTE — Transfer of Care (Signed)
Immediate Anesthesia Transfer of Care Note  Patient: [name]  Procedure(s) Performed: Procedure(s): RADIOACTIVE SEED GUIDED EXCISIONAL BREAST BIOPSY (Left)  Patient Location: PACU  Anesthesia Type:General  Level of Consciousness: sedated  Airway & Oxygen Therapy: Patient Spontanous Breathing and Patient connected to face mask oxygen  Post-op Assessment: Report given to RN and Post -op Vital signs reviewed and stable  Post vital signs: Reviewed and stable  Last Vitals:  Filed Vitals:   01/16/15 0707  BP: 131/82  Pulse: 88  Temp: 36.7 C  Resp: 18    Complications: No apparent anesthesia complications

## 2015-01-16 NOTE — Interval H&P Note (Signed)
History and Physical Interval Note:  01/16/2015 7:49 AM  [name]  has presented today for surgery, with the diagnosis of Microcalcifications of the left breast   Husband and daughter in room.  he various methods of treatment have been discussed with the patient and family. After consideration of risks, benefits and other options for treatment, the patient has consented to  Procedure(s): RADIOACTIVE SEED GUIDED EXCISIONAL BREAST BIOPSY (Left) as a surgical intervention .  The patient's history has been reviewed, patient examined, no change in status, stable for surgery.  I have reviewed the patient's chart and labs.  Questions were answered to the patient's satisfaction.     [name] H

## 2015-01-16 NOTE — Anesthesia Procedure Notes (Signed)
Procedure Name: LMA Insertion Date/Time: 01/16/2015 8:01 AM Performed by: Maryella Shivers Pre-anesthesia Checklist: Patient identified, Emergency Drugs available, Suction available and Patient being monitored Patient Re-evaluated:Patient Re-evaluated prior to inductionOxygen Delivery Method: Circle System Utilized Preoxygenation: Pre-oxygenation with 100% oxygen Intubation Type: IV induction Ventilation: Mask ventilation without difficulty LMA: LMA inserted LMA Size: 4.0 Number of attempts: 1 Airway Equipment and Method: Bite block Placement Confirmation: positive ETCO2 Tube secured with: Tape Dental Injury: Teeth and Oropharynx as per pre-operative assessment

## 2015-01-18 ENCOUNTER — Encounter (HOSPITAL_BASED_OUTPATIENT_CLINIC_OR_DEPARTMENT_OTHER): Payer: Self-pay | Admitting: Surgery

## 2015-01-22 LAB — POCT HEMOGLOBIN-HEMACUE: Hemoglobin: 15.8 g/dL — ABNORMAL HIGH (ref 12.0–15.0)

## 2015-07-17 ENCOUNTER — Emergency Department (HOSPITAL_COMMUNITY): Payer: BLUE CROSS/BLUE SHIELD

## 2015-07-17 ENCOUNTER — Encounter (HOSPITAL_COMMUNITY): Payer: Self-pay | Admitting: Emergency Medicine

## 2015-07-17 ENCOUNTER — Emergency Department (HOSPITAL_COMMUNITY)
Admission: EM | Admit: 2015-07-17 | Discharge: 2015-07-18 | Disposition: A | Discharge status: Home / Self Care | Payer: BLUE CROSS/BLUE SHIELD | Attending: Emergency Medicine | Admitting: Emergency Medicine

## 2015-07-17 DIAGNOSIS — E079 Disorder of thyroid, unspecified: Secondary | ICD-10-CM | POA: Insufficient documentation

## 2015-07-17 DIAGNOSIS — Z79899 Other long term (current) drug therapy: Secondary | ICD-10-CM | POA: Diagnosis not present

## 2015-07-17 DIAGNOSIS — Z98811 Dental restoration status: Secondary | ICD-10-CM | POA: Diagnosis not present

## 2015-07-17 DIAGNOSIS — R1310 Dysphagia, unspecified: Secondary | ICD-10-CM | POA: Insufficient documentation

## 2015-07-17 DIAGNOSIS — Z87891 Personal history of nicotine dependence: Secondary | ICD-10-CM | POA: Diagnosis not present

## 2015-07-17 DIAGNOSIS — Z853 Personal history of malignant neoplasm of breast: Secondary | ICD-10-CM | POA: Insufficient documentation

## 2015-07-17 DIAGNOSIS — Z87828 Personal history of other (healed) physical injury and trauma: Secondary | ICD-10-CM | POA: Insufficient documentation

## 2015-07-17 DIAGNOSIS — R079 Chest pain, unspecified: Secondary | ICD-10-CM | POA: Insufficient documentation

## 2015-07-17 DIAGNOSIS — J029 Acute pharyngitis, unspecified: Secondary | ICD-10-CM | POA: Insufficient documentation

## 2015-07-17 MED ORDER — GI COCKTAIL ~~LOC~~
30.0000 mL | Freq: Once | ORAL | Status: AC
  Administered 2015-07-17: 30 mL via ORAL
  Filled 2015-07-17: qty 30

## 2015-07-17 NOTE — ED Provider Notes (Signed)
CSN: [id number]     Arrival date & time 07/17/15  2206 History  This chart was scribed for Ripley Fraise, MD by Helane Gunther, ED Scribe. This patient was seen in room APA02/APA02 and the patient's care was started at 11:16 PM.    Chief Complaint  Patient presents with  . Dysphagia   The history is provided by the patient. No language interpreter was used.   HPI Comments: [name] is a 54 y.o. female who presents to the Emergency Department complaining of dysphagia onset 1 day ago. She notes it feels as though her throat is scratchy with something stuck in it. She states it is becoming increasingly difficult to swallow. She notes associated burning lower left chest pain after swallowing. Pt denies fever, vomiting, drooling, bloody stool, diarrhea, and SOB.  Past Medical History  Diagnosis Date  . Thyroid disease   . Headache(784.0)   . Cervical muscle strain   . Lumbar strain   . Cancer 2012    Breast  . Wears dentures     upper   Past Surgical History  Procedure Laterality Date  . Breast surgery  04/10/2011    Right mastectomy and slnbx  . Tubal ligation    . Port-a-cath removal  2012    insertion and out  . Lymph node biopsy  1996    rt groin  . Radioactive seed guided excisional breast biopsy Left 01/16/2015    Procedure: RADIOACTIVE SEED GUIDED EXCISIONAL BREAST BIOPSY;  Surgeon: Alphonsa Overall, MD;  Location: [facility];  Service: General;  Laterality: Left;   Family History  Problem Relation Age of Onset  . Stroke Mother   . Hypertension Mother   . Heart disease Mother   . Cancer Maternal Grandmother     Breast cancer   Social History  Substance Use Topics  . Smoking status: Former Smoker -- 0.50 packs/day for 30 years    Types: Cigarettes    Quit date: 11/09/2013  . Smokeless tobacco: Never Used  . Alcohol Use: 1.2 oz/week    2 Cans of beer per week     Comment: 1-2 DRINKS WEEKLY   OB History    No data available     Review of  Systems  Constitutional: Negative for fever.  HENT: Positive for sore throat and trouble swallowing. Negative for drooling.   Respiratory: Negative for shortness of breath.   Cardiovascular: Positive for chest pain.       CP with swallowing   Gastrointestinal: Negative for vomiting, diarrhea and blood in stool.  All other systems reviewed and are negative.   Allergies  Sulfa antibiotics and Codeine  Home Medications   Prior to Admission medications   Medication Sig Start Date End Date Taking? Authorizing Provider  ALPRAZolam (XANAX) 0.25 MG tablet Take 0.25 mg by mouth 2 (two) times daily as needed. Take 2 a day    Historical Provider, MD  Calcium Carbonate-Vitamin D (CALCIUM + D PO) Take by mouth.      Historical Provider, MD  HYDROcodone-acetaminophen (NORCO/VICODIN) 5-325 MG per tablet Take 1-2 tablets by mouth every 6 (six) hours as needed. 01/16/15   Alphonsa Overall, MD  levothyroxine (SYNTHROID, LEVOTHROID) 88 MCG tablet Take 88 mcg by mouth daily before breakfast.    Historical Provider, MD  Multiple Vitamin (MULTIVITAMIN) capsule Take 1 capsule by mouth daily.      Historical Provider, MD  nortriptyline (PAMELOR) 10 MG capsule Take 1 capsule (10 mg total) by mouth at  bedtime. 09/20/14   Ward Givens, NP  venlafaxine (EFFEXOR) 37.5 MG tablet Take 37.5 mg by mouth daily.    Historical Provider, MD   BP 157/87 mmHg  Pulse 103  Temp(Src) 98 F (36.7 C) (Oral)  Resp 18  Ht 5\' 8"  (1.727 m)  Wt 118 lb (53.524 kg)  BMI 17.95 kg/m2  SpO2 98% Physical Exam CONSTITUTIONAL: Well developed/well nourished HEAD: Normocephalic/atraumatic EYES: EOMI/PERRL ENMT: Mucous membranes moist Uvula midline, no erythema or exudates, no stridor NECK: supple no meningeal signs, no erythema, no edema, no crepitus SPINE/BACK:entire spine nontender CV: S1/S2 noted, no murmurs/rubs/gallops noted LUNGS: Lungs are clear to auscultation bilaterally, no apparent distress ABDOMEN: soft, nontender, no  rebound or guarding, bowel sounds noted throughout abdomen GU:no cva tenderness NEURO: Pt is awake/alert/appropriate, moves all extremitiesx4.  No facial droop.   EXTREMITIES: pulses normal/equal, full ROM SKIN: warm, color normal PSYCH: no abnormalities of mood noted, alert and oriented to situation   ED Course  Procedures  DIAGNOSTIC STUDIES: Oxygen Saturation is 98% on RA, normal by my interpretation.    COORDINATION OF CARE: 11:21 PM - Discussed plans to order diagnostic imaging. If it comes back normal, will discharge with Prilosec and refer to a specialist for f/u. Pt advised of plan for treatment and pt agrees.   Imaging Review Dg Neck Soft Tissue  07/17/2015   CLINICAL DATA:  Dysphagia, onset 1 day ago. Throat feels scratchy is this something is stuck in it. increasing difficulty swallowing.  EXAM: NECK SOFT TISSUES - 1+ VIEW  COMPARISON:  None.  FINDINGS: There is no evidence of retropharyngeal soft tissue swelling or epiglottic enlargement. The cervical airway is unremarkable and no radio-opaque foreign body identified. Degenerative changes in the cervical spine.  IMPRESSION: Negative.   Electronically Signed   By: Lucienne Capers M.D.   On: 07/17/2015 23:54   I have personally reviewed and evaluated these images results as part of my medical decision-making.     Pt well appearing, smiling no distress Reports improvement after taking GI cocktail No signs of FB on xray Reports Chest burning only with swallowing Stable for d/c home Referred to GI MDM   Final diagnoses:  Odynophagia    Nursing notes including past medical history and social history reviewed and considered in documentation xrays/imaging reviewed by myself and considered during evaluation   I personally performed the services described in this documentation, which was scribed in my presence. The recorded information has been reviewed and is accurate.      Ripley Fraise, MD 07/18/15 0030

## 2015-07-17 NOTE — ED Notes (Signed)
Pt states that since yesterday she has felt as if something is stuck in her throat - it is sharp and is becoming more pain full - Able to swallow thing and pain is starting to radiate downwards

## 2015-07-18 MED ORDER — OMEPRAZOLE 20 MG PO CPDR: 20.0000 mg | DELAYED_RELEASE_CAPSULE | Freq: Every day | ORAL | Status: DC

## 2015-07-18 NOTE — Discharge Instructions (Signed)

## 2015-10-05 ENCOUNTER — Encounter: Payer: Self-pay | Admitting: Pediatrics

## 2015-10-05 ENCOUNTER — Ambulatory Visit (INDEPENDENT_AMBULATORY_CARE_PROVIDER_SITE_OTHER): Payer: BLUE CROSS/BLUE SHIELD | Admitting: Pediatrics

## 2015-10-05 VITALS — BP 135/90 | HR 91 | Temp 97.8°F | Ht 68.0 in | Wt 126.6 lb

## 2015-10-05 DIAGNOSIS — M25561 Pain in right knee: Secondary | ICD-10-CM

## 2015-10-05 NOTE — Progress Notes (Signed)
    Subjective:    Patient ID: [name], female    DOB: 08-12-1961, 54 y.o.   MRN: [id number]  CC: leg pain  HPI: WILLAMENA Rowe is a 54 y.o. female presenting on 10/05/2015 for Leg Pain  Standing on leg  Started having pain in R leg three days ago, pain is ok as long as propped up Some swelling in ankle Doesn't remember hurting ankle Works at [employer]  No one in family with blood clots Pt does smoke No swelling or pain in calf No SOB, coughing, chest pain No recent immobility, surgeries, car trips Not on any hormone replacement  Relevant past medical, surgical, family and social history reviewed and updated as indicated. Interim medical history since our last visit reviewed. Allergies and medications reviewed and updated.   ROS: Per HPI unless specifically indicated above  Past Medical History Patient Active Problem List   Diagnosis Date Noted  . Sprain of lumbosacral (joint) (ligament) 12/01/2012  . Sprain of neck 12/01/2012  . Headache 12/01/2012  . Breast cancer, right breast, Triple negative, CR to neoaduvant chemo, mastectomy 04/10/2011. 12/26/2011    Current Outpatient Prescriptions  Medication Sig Dispense Refill  . Calcium Carbonate-Vitamin D (CALCIUM + D PO) Take by mouth.      . levothyroxine (SYNTHROID, LEVOTHROID) 88 MCG tablet Take 88 mcg by mouth daily before breakfast.    . Multiple Vitamin (MULTIVITAMIN) capsule Take 1 capsule by mouth daily.      . nortriptyline (PAMELOR) 10 MG capsule Take 1 capsule (10 mg total) by mouth at bedtime. 90 capsule 3  . venlafaxine (EFFEXOR) 37.5 MG tablet Take 37.5 mg by mouth daily.     No current facility-administered medications for this visit.       Objective:    BP 135/90 mmHg  Pulse 91  Temp(Src) 97.8 F (36.6 C) (Oral)  Ht 5\' 8"  (1.727 m)  Wt 126 lb 9.6 oz (57.425 kg)  BMI 19.25 kg/m2  Wt Readings from Last 3 Encounters:  10/05/15 126 lb 9.6 oz (57.425 kg)  07/17/15 118 lb (53.524 kg)    01/16/15 123 lb 6 oz (55.963 kg)    Gen: NAD, alert, cooperative with exam, NCAT EYES: EOMI, no scleral injection or icterus ENT:  TMs pearly gray b/l, OP without erythema LYMPH: no cervical LAD CV: NRRR, normal S1/S2, no murmur, distal DP pulses 2+ b/l Resp: CTABL, no wheezes, normal WOB Abd: +BS, soft, NTND. no guarding or organomegaly Neuro: Alert and oriented, strength equal b/l UE and LE, coordination grossly normal MSK: slight amount non-pitting swelling present just proximal to R lateral malleolus of ankle. Full ROM of ankle, some pain with inversion of foot. No hardness, nodules palpated. No tenderness or swelling of calves b/l, calf circumference same b/l     Assessment & Plan:   Lakai was seen today for leg pain. Smoking only risk factor for blood clots. Swelling at the ankle very unlikely to be due to DVT. Rec icing leg, elevation. If becomes more tender, more swelling, redness, RTC.  Diagnoses and all orders for this visit:  Arthralgia of right lower leg    Follow up plan: As needed  Assunta Found, MD [place] 10/05/2015, 10:33 AM

## 2015-10-08 ENCOUNTER — Ambulatory Visit: Payer: Self-pay | Admitting: Family

## 2016-01-15 ENCOUNTER — Encounter: Payer: Self-pay | Admitting: *Deleted

## 2016-02-27 DIAGNOSIS — M7661 Achilles tendinitis, right leg: Secondary | ICD-10-CM | POA: Diagnosis not present

## 2016-04-23 DIAGNOSIS — M79671 Pain in right foot: Secondary | ICD-10-CM | POA: Diagnosis not present

## 2016-04-27 DIAGNOSIS — M7751 Other enthesopathy of right foot: Secondary | ICD-10-CM | POA: Diagnosis not present

## 2016-05-20 DIAGNOSIS — M25571 Pain in right ankle and joints of right foot: Secondary | ICD-10-CM | POA: Diagnosis not present

## 2016-06-04 DIAGNOSIS — E039 Hypothyroidism, unspecified: Secondary | ICD-10-CM | POA: Diagnosis not present

## 2016-06-04 DIAGNOSIS — E782 Mixed hyperlipidemia: Secondary | ICD-10-CM | POA: Diagnosis not present

## 2016-06-04 DIAGNOSIS — F17210 Nicotine dependence, cigarettes, uncomplicated: Secondary | ICD-10-CM | POA: Diagnosis not present

## 2016-06-04 DIAGNOSIS — N951 Menopausal and female climacteric states: Secondary | ICD-10-CM | POA: Diagnosis not present

## 2016-06-04 DIAGNOSIS — Z008 Encounter for other general examination: Secondary | ICD-10-CM | POA: Diagnosis not present

## 2016-06-04 DIAGNOSIS — Z716 Tobacco abuse counseling: Secondary | ICD-10-CM | POA: Diagnosis not present

## 2016-06-09 DIAGNOSIS — E782 Mixed hyperlipidemia: Secondary | ICD-10-CM | POA: Diagnosis not present

## 2016-06-09 DIAGNOSIS — J0100 Acute maxillary sinusitis, unspecified: Secondary | ICD-10-CM | POA: Diagnosis not present

## 2016-06-09 DIAGNOSIS — E039 Hypothyroidism, unspecified: Secondary | ICD-10-CM | POA: Diagnosis not present

## 2016-07-21 DIAGNOSIS — E039 Hypothyroidism, unspecified: Secondary | ICD-10-CM | POA: Diagnosis not present

## 2016-09-02 DIAGNOSIS — Z23 Encounter for immunization: Secondary | ICD-10-CM | POA: Diagnosis not present

## 2016-09-10 DIAGNOSIS — E039 Hypothyroidism, unspecified: Secondary | ICD-10-CM | POA: Diagnosis not present

## 2016-12-03 DIAGNOSIS — J029 Acute pharyngitis, unspecified: Secondary | ICD-10-CM | POA: Diagnosis not present

## 2016-12-03 DIAGNOSIS — B349 Viral infection, unspecified: Secondary | ICD-10-CM | POA: Diagnosis not present

## 2016-12-17 DIAGNOSIS — F17210 Nicotine dependence, cigarettes, uncomplicated: Secondary | ICD-10-CM | POA: Diagnosis not present

## 2016-12-17 DIAGNOSIS — J0100 Acute maxillary sinusitis, unspecified: Secondary | ICD-10-CM | POA: Diagnosis not present

## 2016-12-17 DIAGNOSIS — E039 Hypothyroidism, unspecified: Secondary | ICD-10-CM | POA: Diagnosis not present

## 2016-12-17 DIAGNOSIS — Z716 Tobacco abuse counseling: Secondary | ICD-10-CM | POA: Diagnosis not present

## 2017-01-14 DIAGNOSIS — F17210 Nicotine dependence, cigarettes, uncomplicated: Secondary | ICD-10-CM | POA: Diagnosis not present

## 2017-01-14 DIAGNOSIS — Z716 Tobacco abuse counseling: Secondary | ICD-10-CM | POA: Diagnosis not present

## 2017-01-14 DIAGNOSIS — M461 Sacroiliitis, not elsewhere classified: Secondary | ICD-10-CM | POA: Diagnosis not present

## 2017-02-25 DIAGNOSIS — Z716 Tobacco abuse counseling: Secondary | ICD-10-CM | POA: Diagnosis not present

## 2017-02-25 DIAGNOSIS — E039 Hypothyroidism, unspecified: Secondary | ICD-10-CM | POA: Diagnosis not present

## 2017-02-25 DIAGNOSIS — F17210 Nicotine dependence, cigarettes, uncomplicated: Secondary | ICD-10-CM | POA: Diagnosis not present

## 2017-03-11 DIAGNOSIS — J111 Influenza due to unidentified influenza virus with other respiratory manifestations: Secondary | ICD-10-CM | POA: Diagnosis not present

## 2017-03-16 DIAGNOSIS — E782 Mixed hyperlipidemia: Secondary | ICD-10-CM | POA: Diagnosis not present

## 2017-03-16 DIAGNOSIS — E039 Hypothyroidism, unspecified: Secondary | ICD-10-CM | POA: Diagnosis not present

## 2017-03-16 DIAGNOSIS — Z139 Encounter for screening, unspecified: Secondary | ICD-10-CM | POA: Diagnosis not present

## 2017-03-25 ENCOUNTER — Other Ambulatory Visit: Payer: Self-pay | Admitting: Family Medicine

## 2017-03-25 ENCOUNTER — Encounter: Payer: Self-pay | Admitting: *Deleted

## 2017-03-25 ENCOUNTER — Ambulatory Visit (INDEPENDENT_AMBULATORY_CARE_PROVIDER_SITE_OTHER): Payer: BLUE CROSS/BLUE SHIELD

## 2017-03-25 ENCOUNTER — Ambulatory Visit (INDEPENDENT_AMBULATORY_CARE_PROVIDER_SITE_OTHER): Payer: BLUE CROSS/BLUE SHIELD | Admitting: Family Medicine

## 2017-03-25 ENCOUNTER — Encounter: Payer: Self-pay | Admitting: Family Medicine

## 2017-03-25 VITALS — BP 137/88 | HR 93 | Temp 97.8°F | Ht 68.0 in | Wt 126.0 lb

## 2017-03-25 DIAGNOSIS — S92521A Displaced fracture of medial phalanx of right lesser toe(s), initial encounter for closed fracture: Secondary | ICD-10-CM | POA: Diagnosis not present

## 2017-03-25 DIAGNOSIS — R52 Pain, unspecified: Secondary | ICD-10-CM

## 2017-03-25 DIAGNOSIS — M79671 Pain in right foot: Secondary | ICD-10-CM | POA: Diagnosis not present

## 2017-03-25 DIAGNOSIS — S99921A Unspecified injury of right foot, initial encounter: Secondary | ICD-10-CM | POA: Diagnosis not present

## 2017-03-25 MED ORDER — ACETAMINOPHEN-CODEINE #3 300-30 MG PO TABS: 1.0000 | ORAL_TABLET | ORAL | 0 refills | Status: DC | PRN

## 2017-03-25 NOTE — Progress Notes (Signed)
Subjective:  Patient ID: [name], female    DOB: 10/20/1961  Age: 56 y.o. MRN: [id number]  CC: right foot pain   HPI [name] presents for fall earlier today. Pain, swelling & bruising noted at base of 2-3 toes dorsally. Unable to bear weight.  History [name] has a past medical history of Cancer ([place]) (2012); Cervical muscle strain; Headache(784.0); Lumbar strain; Thyroid disease; and Wears dentures.   She has a past surgical history that includes Breast surgery (04/10/2011); Tubal ligation; Port-a-cath removal (2012); Lymph node biopsy (1996); and Radioactive seed guided excisional breast biopsy (Left, 01/16/2015).   Her family history includes Cancer in her maternal grandmother; Heart disease in her mother; Hypertension in her mother; Stroke in her mother.She reports that she quit smoking about 3 years ago. Her smoking use included Cigarettes. She has a 15.00 pack-year smoking history. She has never used smokeless tobacco. She reports that she drinks about 1.2 oz of alcohol per week . She reports that she does not use drugs.  Current Outpatient Prescriptions on File Prior to Visit  Medication Sig Dispense Refill  . Calcium Carbonate-Vitamin D (CALCIUM + D PO) Take by mouth.      . levothyroxine (SYNTHROID, LEVOTHROID) 88 MCG tablet Take 88 mcg by mouth daily before breakfast.    . Multiple Vitamin (MULTIVITAMIN) capsule Take 1 capsule by mouth daily.      . nortriptyline (PAMELOR) 10 MG capsule Take 1 capsule (10 mg total) by mouth at bedtime. 90 capsule 3  . venlafaxine (EFFEXOR) 37.5 MG tablet Take 37.5 mg by mouth daily.     No current facility-administered medications on file prior to visit.     ROS Review of Systems  Constitutional: Negative for activity change, appetite change and fever.  HENT: Negative for congestion, rhinorrhea and sore throat.   Eyes: Negative for visual disturbance.  Respiratory: Negative for cough and shortness of breath.   Cardiovascular:  Negative for chest pain and palpitations.  Gastrointestinal: Negative for abdominal pain, diarrhea and nausea.  Genitourinary: Negative for dysuria.    Objective:  BP 137/88 (BP Location: Left Arm)   Pulse 93   Temp 97.8 F (36.6 C) (Oral)   Ht 5\' 8"  (1.727 m)   Wt 126 lb (57.2 kg)   BMI 19.16 kg/m   Physical Exam  Constitutional: She is oriented to person, place, and time. She appears well-developed and well-nourished. No distress.  HENT:  Head: Normocephalic and atraumatic.  Eyes: Conjunctivae are normal. Pupils are equal, round, and reactive to light.  Neck: Normal range of motion. Neck supple. No thyromegaly present.  Cardiovascular: Normal rate, regular rhythm and normal heart sounds.   No murmur heard. Pulmonary/Chest: Effort normal and breath sounds normal. No respiratory distress. She has no wheezes. She has no rales.  Abdominal: Soft. Bowel sounds are normal. She exhibits no distension. There is no tenderness.  Musculoskeletal: Normal range of motion.  The second and third toes of the right foot have marked hematoma formation extending to the level of the MTP joints. There is 2+ edema. Exquisitely tender. Unable to move the toe joints.  Lymphadenopathy:    She has no cervical adenopathy.  Neurological: She is alert and oriented to person, place, and time.  Skin: Skin is warm and dry.  Psychiatric: She has a normal mood and affect.    Assessment & Plan:   [name] was seen today for right foot pain.  Diagnoses and all orders for this visit:  Disp  fx of medial phalanx of right lesser toe(s), init -     Ambulatory referral to Orthopedics  Other orders -     acetaminophen-codeine (TYLENOL #3) 300-30 MG tablet; Take 1-2 tablets by mouth every 4 (four) hours as needed for moderate pain.   I am having [name] start on acetaminophen-codeine. I am also having her maintain her multivitamin, Calcium Carbonate-Vitamin D (CALCIUM + D PO), venlafaxine, levothyroxine, and  nortriptyline.  Meds ordered this encounter  Medications  . acetaminophen-codeine (TYLENOL #3) 300-30 MG tablet    Sig: Take 1-2 tablets by mouth every 4 (four) hours as needed for moderate pain.    Dispense:  24 tablet    Refill:  0   X-ray reviewed showing fracture that is comminuted and extends into the interphalangeal joint space.  Follow-up: Return if symptoms worsen or fail to improve.  Claretta Fraise, M.D.

## 2017-03-27 DIAGNOSIS — S92591A Other fracture of right lesser toe(s), initial encounter for closed fracture: Secondary | ICD-10-CM | POA: Diagnosis not present

## 2017-03-30 DIAGNOSIS — N951 Menopausal and female climacteric states: Secondary | ICD-10-CM | POA: Diagnosis not present

## 2017-03-30 DIAGNOSIS — R7989 Other specified abnormal findings of blood chemistry: Secondary | ICD-10-CM | POA: Diagnosis not present

## 2017-03-30 DIAGNOSIS — E039 Hypothyroidism, unspecified: Secondary | ICD-10-CM | POA: Diagnosis not present

## 2017-03-30 DIAGNOSIS — E782 Mixed hyperlipidemia: Secondary | ICD-10-CM | POA: Diagnosis not present

## 2017-04-09 DIAGNOSIS — S92591D Other fracture of right lesser toe(s), subsequent encounter for fracture with routine healing: Secondary | ICD-10-CM | POA: Diagnosis not present

## 2017-05-04 DIAGNOSIS — S92591D Other fracture of right lesser toe(s), subsequent encounter for fracture with routine healing: Secondary | ICD-10-CM | POA: Diagnosis not present

## 2017-05-18 DIAGNOSIS — S92591D Other fracture of right lesser toe(s), subsequent encounter for fracture with routine healing: Secondary | ICD-10-CM | POA: Diagnosis not present

## 2017-05-20 DIAGNOSIS — E039 Hypothyroidism, unspecified: Secondary | ICD-10-CM | POA: Diagnosis not present

## 2017-05-20 DIAGNOSIS — Z719 Counseling, unspecified: Secondary | ICD-10-CM | POA: Diagnosis not present

## 2017-05-20 DIAGNOSIS — Z716 Tobacco abuse counseling: Secondary | ICD-10-CM | POA: Diagnosis not present

## 2017-05-20 DIAGNOSIS — F17210 Nicotine dependence, cigarettes, uncomplicated: Secondary | ICD-10-CM | POA: Diagnosis not present

## 2017-05-20 DIAGNOSIS — N951 Menopausal and female climacteric states: Secondary | ICD-10-CM | POA: Diagnosis not present

## 2017-05-20 DIAGNOSIS — R7989 Other specified abnormal findings of blood chemistry: Secondary | ICD-10-CM | POA: Diagnosis not present

## 2017-05-20 DIAGNOSIS — E782 Mixed hyperlipidemia: Secondary | ICD-10-CM | POA: Diagnosis not present

## 2017-05-25 DIAGNOSIS — Z716 Tobacco abuse counseling: Secondary | ICD-10-CM | POA: Diagnosis not present

## 2017-05-25 DIAGNOSIS — F17210 Nicotine dependence, cigarettes, uncomplicated: Secondary | ICD-10-CM | POA: Diagnosis not present

## 2017-05-25 DIAGNOSIS — J302 Other seasonal allergic rhinitis: Secondary | ICD-10-CM | POA: Diagnosis not present

## 2017-05-25 DIAGNOSIS — E039 Hypothyroidism, unspecified: Secondary | ICD-10-CM | POA: Diagnosis not present

## 2017-05-25 DIAGNOSIS — R7989 Other specified abnormal findings of blood chemistry: Secondary | ICD-10-CM | POA: Diagnosis not present

## 2017-06-03 DIAGNOSIS — R7989 Other specified abnormal findings of blood chemistry: Secondary | ICD-10-CM | POA: Diagnosis not present

## 2017-06-03 DIAGNOSIS — E039 Hypothyroidism, unspecified: Secondary | ICD-10-CM | POA: Diagnosis not present

## 2017-06-11 ENCOUNTER — Telehealth: Payer: Self-pay | Admitting: Pediatrics

## 2017-06-11 NOTE — Telephone Encounter (Signed)
Scheduled 11/30 w/[facility]

## 2017-06-20 ENCOUNTER — Ambulatory Visit (INDEPENDENT_AMBULATORY_CARE_PROVIDER_SITE_OTHER): Payer: BLUE CROSS/BLUE SHIELD | Admitting: Family Medicine

## 2017-06-20 ENCOUNTER — Encounter: Payer: Self-pay | Admitting: Family Medicine

## 2017-06-20 VITALS — BP 141/80 | HR 90 | Temp 99.2°F | Ht 68.0 in | Wt 116.0 lb

## 2017-06-20 DIAGNOSIS — J0100 Acute maxillary sinusitis, unspecified: Secondary | ICD-10-CM | POA: Diagnosis not present

## 2017-06-20 MED ORDER — PSEUDOEPHEDRINE-GUAIFENESIN ER 60-600 MG PO TB12: 1.0000 | ORAL_TABLET | Freq: Two times a day (BID) | ORAL | 0 refills | Status: AC

## 2017-06-20 MED ORDER — AMOXICILLIN-POT CLAVULANATE 875-125 MG PO TABS: 1.0000 | ORAL_TABLET | Freq: Two times a day (BID) | ORAL | 0 refills | Status: DC

## 2017-06-20 NOTE — Progress Notes (Addendum)
Chief Complaint  Patient presents with  . Sinusitis    pt here today c/o ears stopped up, congestion, itchy/watery eyes, cough and sinus pressure.    HPI  Patient presents today for Symptoms include congestion, facial pain, nasal congestion, non productive cough, post nasal drip and right cheek sinus pressure. There is no fever, chills, or sweats. Onset of symptoms was four days ago, gradually worsening since that time.    PMH: Smoking status noted ROS: Per HPI  Objective: BP (!) 141/80   Pulse 90   Temp 99.2 F (37.3 C) (Oral)   Ht 5\' 8"  (1.727 m)   Wt 116 lb (52.6 kg)   BMI 17.64 kg/m  Gen: NAD, alert, cooperative with exam HEENT: NCAT, EOMI, PERRLNasal passages swollen. Marked tenderness to the right maxillary sinus. Fluid noted behind the right TM. Left TM is clear the remaining sinuses were nontender. CV: RRR, good S1/S2, no murmur Resp: CTABL, no wheezes, non-labored Neuro: Alert and oriented, No gross deficits  Assessment and plan:  1. Acute maxillary sinusitis, recurrence not specified     Meds ordered this encounter  Medications  . levothyroxine (SYNTHROID, LEVOTHROID) 100 MCG tablet    Sig: Take 100 mcg by mouth daily.  Marland Kitchen amoxicillin-clavulanate (AUGMENTIN) 875-125 MG tablet    Sig: Take 1 tablet by mouth 2 (two) times daily. Take all of this medication    Dispense:  20 tablet    Refill:  0  . pseudoephedrine-guaifenesin (MUCINEX D) 60-600 MG 12 hr tablet    Sig: Take 1 tablet by mouth every 12 (twelve) hours. As needed for congestion    Dispense:  20 tablet    Refill:  0    No orders of the defined types were placed in this encounter.   Follow up as needed.  Claretta Fraise, MD

## 2017-07-20 DIAGNOSIS — Z716 Tobacco abuse counseling: Secondary | ICD-10-CM | POA: Diagnosis not present

## 2017-07-20 DIAGNOSIS — F17210 Nicotine dependence, cigarettes, uncomplicated: Secondary | ICD-10-CM | POA: Diagnosis not present

## 2017-08-26 DIAGNOSIS — Z716 Tobacco abuse counseling: Secondary | ICD-10-CM | POA: Diagnosis not present

## 2017-08-26 DIAGNOSIS — F17210 Nicotine dependence, cigarettes, uncomplicated: Secondary | ICD-10-CM | POA: Diagnosis not present

## 2017-08-26 DIAGNOSIS — E039 Hypothyroidism, unspecified: Secondary | ICD-10-CM | POA: Diagnosis not present

## 2017-08-26 DIAGNOSIS — E782 Mixed hyperlipidemia: Secondary | ICD-10-CM | POA: Diagnosis not present

## 2017-08-26 DIAGNOSIS — Z139 Encounter for screening, unspecified: Secondary | ICD-10-CM | POA: Diagnosis not present

## 2017-08-31 DIAGNOSIS — S62364A Nondisplaced fracture of neck of fourth metacarpal bone, right hand, initial encounter for closed fracture: Secondary | ICD-10-CM | POA: Diagnosis not present

## 2017-08-31 DIAGNOSIS — M79641 Pain in right hand: Secondary | ICD-10-CM | POA: Diagnosis not present

## 2017-08-31 DIAGNOSIS — S6991XA Unspecified injury of right wrist, hand and finger(s), initial encounter: Secondary | ICD-10-CM | POA: Diagnosis not present

## 2017-09-10 DIAGNOSIS — S62324D Displaced fracture of shaft of fourth metacarpal bone, right hand, subsequent encounter for fracture with routine healing: Secondary | ICD-10-CM | POA: Diagnosis not present

## 2017-09-10 DIAGNOSIS — S62324A Displaced fracture of shaft of fourth metacarpal bone, right hand, initial encounter for closed fracture: Secondary | ICD-10-CM | POA: Diagnosis not present

## 2017-10-01 DIAGNOSIS — S62324A Displaced fracture of shaft of fourth metacarpal bone, right hand, initial encounter for closed fracture: Secondary | ICD-10-CM | POA: Diagnosis not present

## 2017-10-29 DIAGNOSIS — S62324D Displaced fracture of shaft of fourth metacarpal bone, right hand, subsequent encounter for fracture with routine healing: Secondary | ICD-10-CM | POA: Diagnosis not present

## 2017-11-04 DIAGNOSIS — F17210 Nicotine dependence, cigarettes, uncomplicated: Secondary | ICD-10-CM | POA: Diagnosis not present

## 2017-11-04 DIAGNOSIS — Z716 Tobacco abuse counseling: Secondary | ICD-10-CM | POA: Diagnosis not present

## 2017-11-04 DIAGNOSIS — J0100 Acute maxillary sinusitis, unspecified: Secondary | ICD-10-CM | POA: Diagnosis not present

## 2017-12-07 DIAGNOSIS — Z008 Encounter for other general examination: Secondary | ICD-10-CM | POA: Diagnosis not present

## 2017-12-07 DIAGNOSIS — Z719 Counseling, unspecified: Secondary | ICD-10-CM | POA: Diagnosis not present

## 2017-12-07 DIAGNOSIS — E039 Hypothyroidism, unspecified: Secondary | ICD-10-CM | POA: Diagnosis not present

## 2017-12-07 DIAGNOSIS — E782 Mixed hyperlipidemia: Secondary | ICD-10-CM | POA: Diagnosis not present

## 2017-12-07 DIAGNOSIS — F17210 Nicotine dependence, cigarettes, uncomplicated: Secondary | ICD-10-CM | POA: Diagnosis not present

## 2017-12-07 DIAGNOSIS — N951 Menopausal and female climacteric states: Secondary | ICD-10-CM | POA: Diagnosis not present

## 2017-12-07 DIAGNOSIS — Z716 Tobacco abuse counseling: Secondary | ICD-10-CM | POA: Diagnosis not present

## 2018-01-04 DIAGNOSIS — Z716 Tobacco abuse counseling: Secondary | ICD-10-CM | POA: Diagnosis not present

## 2018-01-04 DIAGNOSIS — F17210 Nicotine dependence, cigarettes, uncomplicated: Secondary | ICD-10-CM | POA: Diagnosis not present

## 2018-01-07 ENCOUNTER — Encounter: Payer: Self-pay | Admitting: Nurse Practitioner

## 2018-01-07 ENCOUNTER — Ambulatory Visit: Payer: BLUE CROSS/BLUE SHIELD | Admitting: Nurse Practitioner

## 2018-01-07 VITALS — BP 132/88 | HR 98 | Temp 97.4°F | Ht 68.0 in | Wt 111.0 lb

## 2018-01-07 DIAGNOSIS — R52 Pain, unspecified: Secondary | ICD-10-CM

## 2018-01-07 DIAGNOSIS — J069 Acute upper respiratory infection, unspecified: Secondary | ICD-10-CM | POA: Diagnosis not present

## 2018-01-07 LAB — VERITOR FLU A/B WAIVED
Influenza A: NEGATIVE
Influenza B: NEGATIVE

## 2018-01-07 MED ORDER — AZITHROMYCIN 250 MG PO TABS: ORAL_TABLET | ORAL | 0 refills | Status: DC

## 2018-01-07 MED ORDER — BENZONATATE 100 MG PO CAPS: 100.0000 mg | ORAL_CAPSULE | Freq: Three times a day (TID) | ORAL | 0 refills | Status: DC | PRN

## 2018-01-07 NOTE — Progress Notes (Signed)
   Subjective:    Patient ID: [name], female    DOB: 1961-07-18, 57 y.o.   MRN: [id number]  HPI Patient comes in today c/o couch, congestion headache and body aches. Started Monday. Temp was 100.9.     Review of Systems  Constitutional: Positive for appetite change (decreased), chills, fatigue and fever (101.5last night).  HENT: Positive for congestion and rhinorrhea. Negative for sore throat and trouble swallowing.   Respiratory: Positive for cough. Choking: nonproductive.   Cardiovascular: Negative.   Musculoskeletal: Positive for arthralgias and myalgias.  Neurological: Positive for headaches.  Psychiatric/Behavioral: Positive for behavioral problems.  All other systems reviewed and are negative.      Objective:   Physical Exam  Constitutional: She is oriented to person, place, and time. She appears well-developed and well-nourished. She appears distressed (mild).  HENT:  Right Ear: Hearing, tympanic membrane, external ear and ear canal normal.  Left Ear: Hearing, tympanic membrane, external ear and ear canal normal.  Nose: Mucosal edema and rhinorrhea present. Right sinus exhibits no maxillary sinus tenderness and no frontal sinus tenderness. Left sinus exhibits no maxillary sinus tenderness and no frontal sinus tenderness.  Mouth/Throat: Uvula is midline. Posterior oropharyngeal erythema (mild) present.  Neck: Normal range of motion. Neck supple.  Cardiovascular: Normal rate and regular rhythm.  Pulmonary/Chest: Effort normal and breath sounds normal.  Neurological: She is alert and oriented to person, place, and time. She has normal reflexes.  Skin: Skin is warm.  Psychiatric: She has a normal mood and affect. Her behavior is normal. Judgment and thought content normal.   BP 132/88   Pulse 98   Temp (!) 97.4 F (36.3 C) (Oral)   Ht 5\' 8"  (1.727 m)   Wt 111 lb (50.3 kg)   BMI 16.88 kg/m   FLU- NEGATIVE      Assessment & Plan:   1. Body aches   2. Upper  respiratory infection with cough and congestion    Meds ordered this encounter  Medications  . azithromycin (ZITHROMAX Z-PAK) 250 MG tablet    Sig: As directed    Dispense:  6 tablet    Refill:  0    Order Specific Question:   Supervising Provider    Answer:   VINCENT, CAROL L [[id number]]  . benzonatate (TESSALON PERLES) 100 MG capsule    Sig: Take 1 capsule (100 mg total) by mouth 3 (three) times daily as needed for cough.    Dispense:  20 capsule    Refill:  0    Order Specific Question:   Supervising Provider    Answer:   VINCENT, CAROL L [[id number]]   1. Take meds as prescribed 2. Use a cool mist humidifier especially during the winter months and when heat has been humid. 3. Use saline nose sprays frequently 4. Saline irrigations of the nose can be very helpful if done frequently.  * 4X daily for 1 week*  * Use of a nettie pot can be helpful with this. Follow directions with this* 5. Drink plenty of fluids 6. Keep thermostat turn down low 7.For any cough or congestion  Use plain Mucinex- regular strength or max strength is fine   * Children- consult with Pharmacist for dosing 8. For fever or aces or pains- take tylenol or ibuprofen appropriate for age and weight.  * for fevers greater than 101 orally you may alternate ibuprofen and tylenol every  3 hours.   Mary-Margaret Hassell Done, FNP

## 2018-01-07 NOTE — Patient Instructions (Signed)

## 2018-02-15 DIAGNOSIS — Z139 Encounter for screening, unspecified: Secondary | ICD-10-CM | POA: Diagnosis not present

## 2018-02-15 DIAGNOSIS — Z716 Tobacco abuse counseling: Secondary | ICD-10-CM | POA: Diagnosis not present

## 2018-02-15 DIAGNOSIS — E039 Hypothyroidism, unspecified: Secondary | ICD-10-CM | POA: Diagnosis not present

## 2018-02-15 DIAGNOSIS — F17210 Nicotine dependence, cigarettes, uncomplicated: Secondary | ICD-10-CM | POA: Diagnosis not present

## 2018-03-01 DIAGNOSIS — Z716 Tobacco abuse counseling: Secondary | ICD-10-CM | POA: Diagnosis not present

## 2018-03-01 DIAGNOSIS — E039 Hypothyroidism, unspecified: Secondary | ICD-10-CM | POA: Diagnosis not present

## 2018-03-01 DIAGNOSIS — F17210 Nicotine dependence, cigarettes, uncomplicated: Secondary | ICD-10-CM | POA: Diagnosis not present

## 2018-03-01 DIAGNOSIS — J302 Other seasonal allergic rhinitis: Secondary | ICD-10-CM | POA: Diagnosis not present

## 2018-04-18 DIAGNOSIS — H40033 Anatomical narrow angle, bilateral: Secondary | ICD-10-CM | POA: Diagnosis not present

## 2018-04-18 DIAGNOSIS — H10013 Acute follicular conjunctivitis, bilateral: Secondary | ICD-10-CM | POA: Diagnosis not present

## 2018-05-05 DIAGNOSIS — E039 Hypothyroidism, unspecified: Secondary | ICD-10-CM | POA: Diagnosis not present

## 2018-05-05 DIAGNOSIS — E782 Mixed hyperlipidemia: Secondary | ICD-10-CM | POA: Diagnosis not present

## 2018-05-05 DIAGNOSIS — Z79899 Other long term (current) drug therapy: Secondary | ICD-10-CM | POA: Diagnosis not present

## 2018-05-11 DIAGNOSIS — L603 Nail dystrophy: Secondary | ICD-10-CM | POA: Diagnosis not present

## 2018-05-19 DIAGNOSIS — E782 Mixed hyperlipidemia: Secondary | ICD-10-CM | POA: Diagnosis not present

## 2018-05-19 DIAGNOSIS — E039 Hypothyroidism, unspecified: Secondary | ICD-10-CM | POA: Diagnosis not present

## 2018-07-14 DIAGNOSIS — E039 Hypothyroidism, unspecified: Secondary | ICD-10-CM | POA: Diagnosis not present

## 2018-07-14 DIAGNOSIS — Z139 Encounter for screening, unspecified: Secondary | ICD-10-CM | POA: Diagnosis not present

## 2018-07-14 DIAGNOSIS — E782 Mixed hyperlipidemia: Secondary | ICD-10-CM | POA: Diagnosis not present

## 2018-07-14 DIAGNOSIS — M542 Cervicalgia: Secondary | ICD-10-CM | POA: Diagnosis not present

## 2018-08-02 DIAGNOSIS — F17210 Nicotine dependence, cigarettes, uncomplicated: Secondary | ICD-10-CM | POA: Diagnosis not present

## 2018-08-02 DIAGNOSIS — E039 Hypothyroidism, unspecified: Secondary | ICD-10-CM | POA: Diagnosis not present

## 2018-08-02 DIAGNOSIS — E782 Mixed hyperlipidemia: Secondary | ICD-10-CM | POA: Diagnosis not present

## 2018-08-02 DIAGNOSIS — Z716 Tobacco abuse counseling: Secondary | ICD-10-CM | POA: Diagnosis not present

## 2018-08-02 DIAGNOSIS — Z008 Encounter for other general examination: Secondary | ICD-10-CM | POA: Diagnosis not present

## 2018-08-08 IMAGING — DX DG FOOT COMPLETE 3+V*R*
3 series · 3 of 3 positions shown · non-contrast
Comparison: None

CLINICAL DATA: RIGHT foot pain, hit fell on edge of floor, pain at
second toe

EXAM:
RIGHT FOOT COMPLETE - 3+ VIEW

[foot ap]
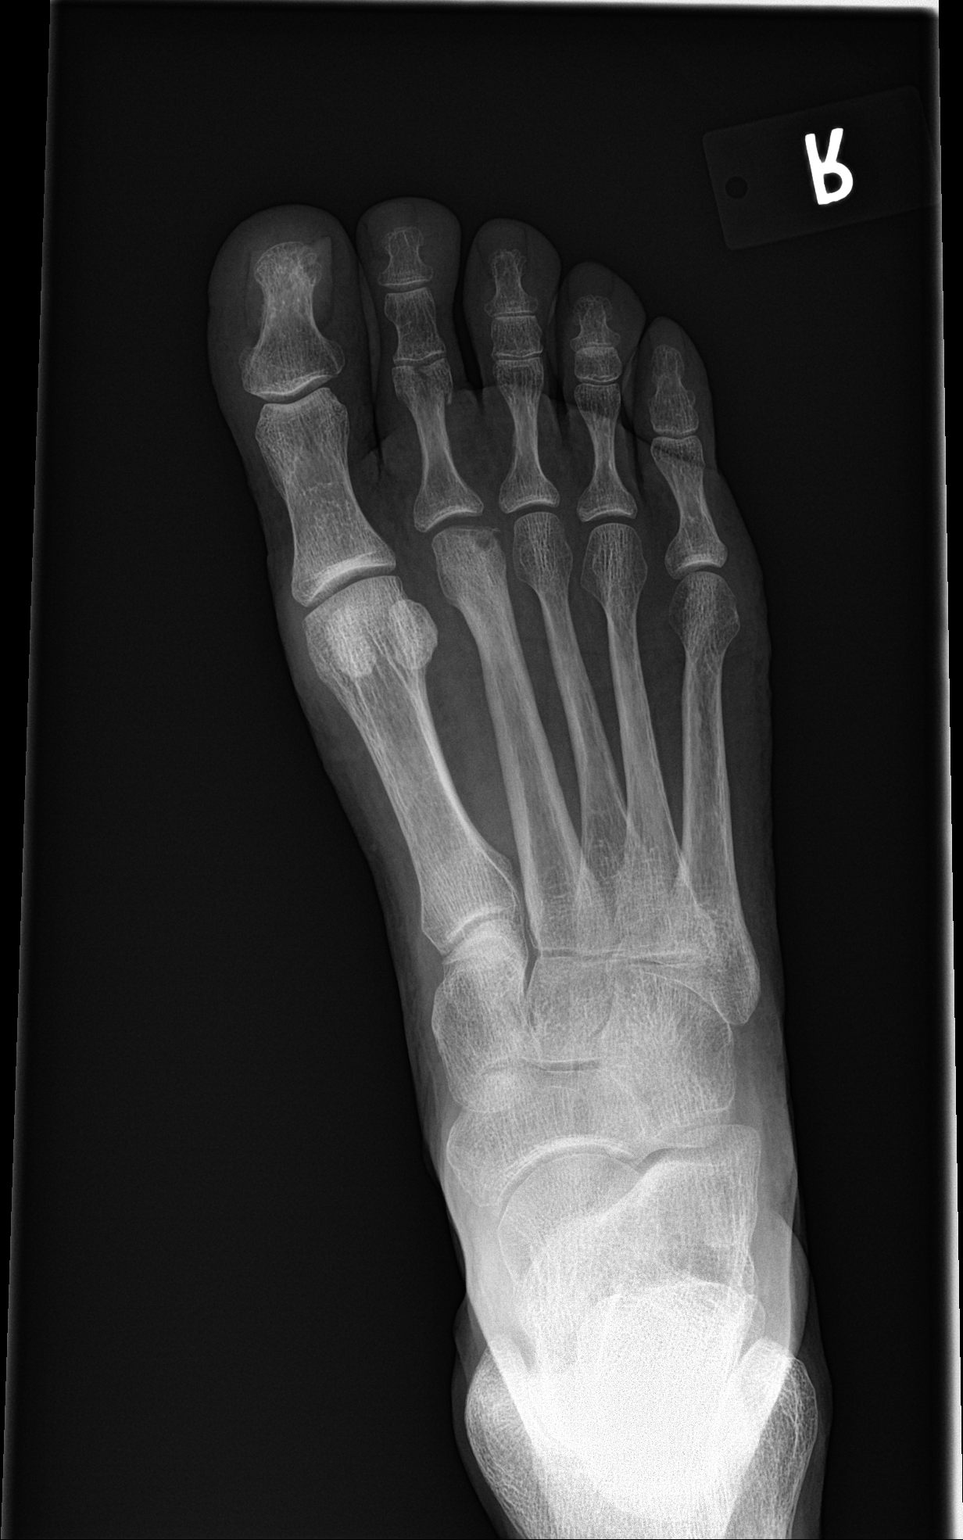

[foot obl]
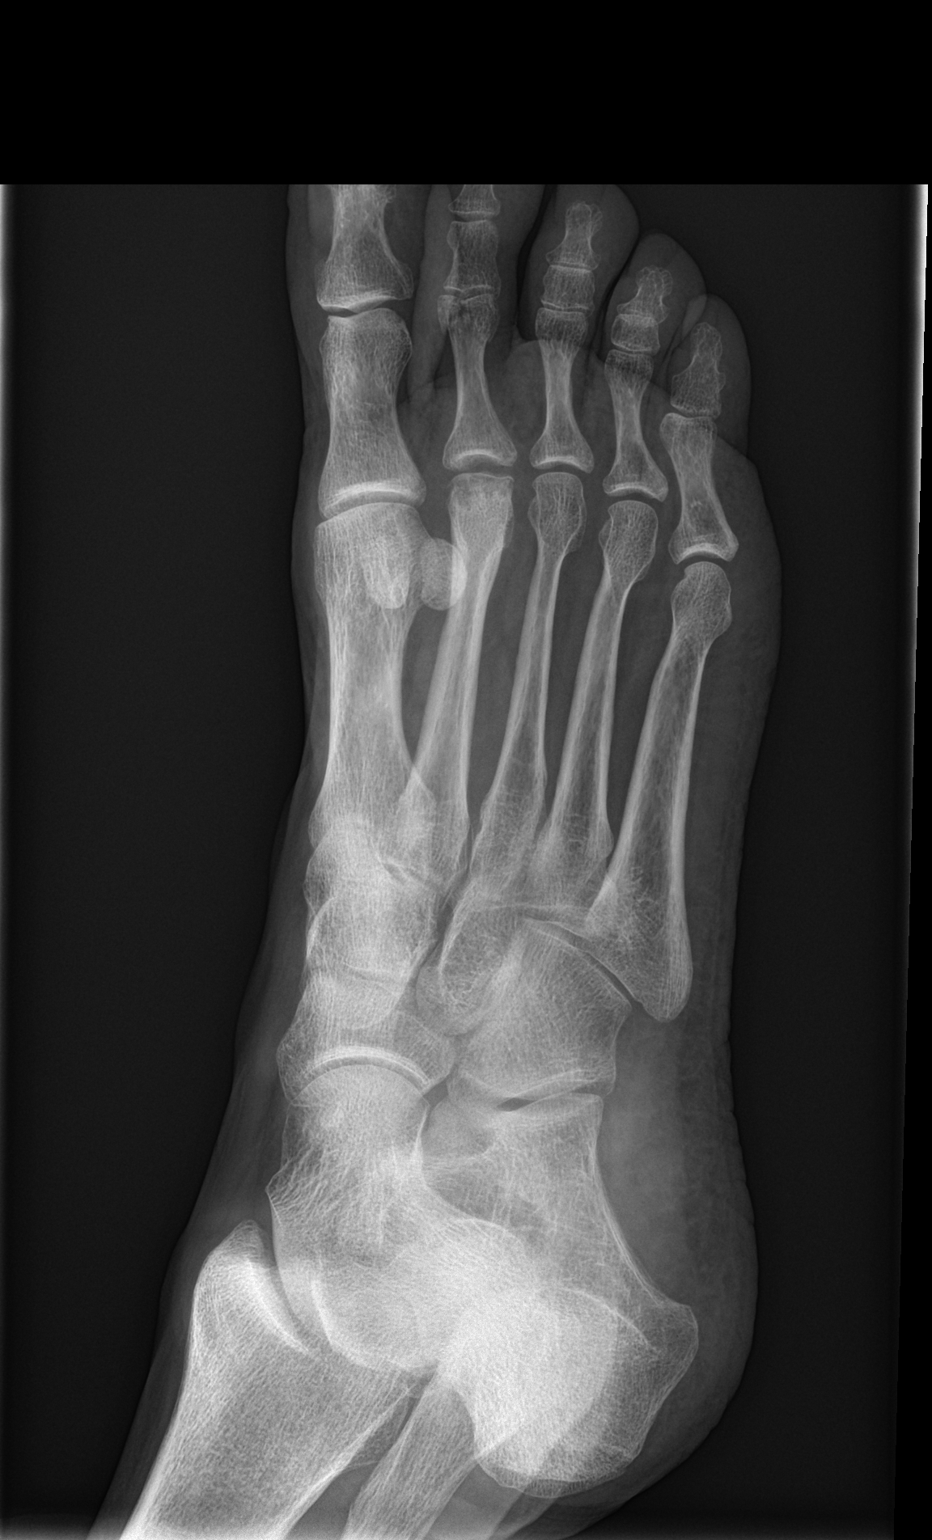

[foot lat]
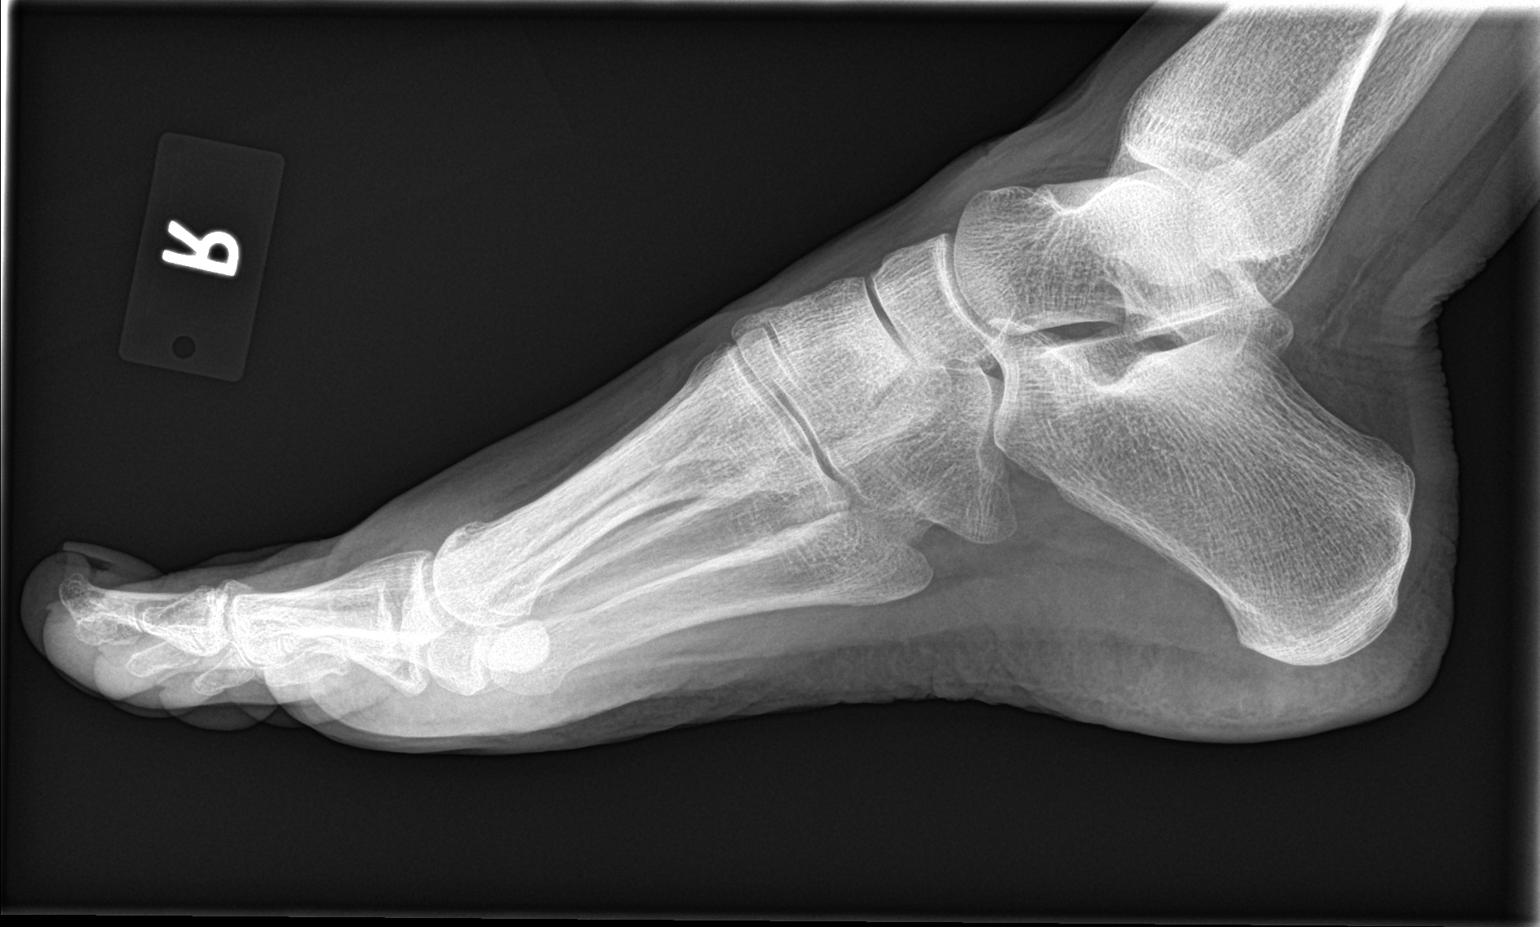

[3 of 3 positions shown; findings below may reference images not displayed]

FINDINGS: Mild osseous demineralization.

Joint spaces preserved.

Minimally displaced comminuted intra-articular fracture at head of
proximal phalanx RIGHT second toe.

Additionally, flattening of the second metatarsal head is seen; this
could represent an acute impaction fracture or Tiger infraction,
but due to the somewhat rounded lucency laterally favor a more
chronic process such as Tiger infraction.

No additional fracture, dislocation, or bone destruction.
IMPRESSION: Comminuted minimally displaced fracture at head of proximal phalanx
RIGHT second toe.

Changes at the second metatarsal head favor Tiger infraction as
discussed above.

## 2018-08-17 DIAGNOSIS — Z23 Encounter for immunization: Secondary | ICD-10-CM | POA: Diagnosis not present

## 2018-08-30 DIAGNOSIS — R42 Dizziness and giddiness: Secondary | ICD-10-CM | POA: Diagnosis not present

## 2018-08-30 DIAGNOSIS — J302 Other seasonal allergic rhinitis: Secondary | ICD-10-CM | POA: Diagnosis not present

## 2018-08-30 DIAGNOSIS — N951 Menopausal and female climacteric states: Secondary | ICD-10-CM | POA: Diagnosis not present

## 2018-10-25 DIAGNOSIS — E039 Hypothyroidism, unspecified: Secondary | ICD-10-CM | POA: Diagnosis not present

## 2018-10-25 DIAGNOSIS — Z79899 Other long term (current) drug therapy: Secondary | ICD-10-CM | POA: Diagnosis not present

## 2018-10-25 DIAGNOSIS — Z0130 Encounter for examination of blood pressure without abnormal findings: Secondary | ICD-10-CM | POA: Diagnosis not present

## 2018-10-25 DIAGNOSIS — E782 Mixed hyperlipidemia: Secondary | ICD-10-CM | POA: Diagnosis not present

## 2018-11-08 DIAGNOSIS — E782 Mixed hyperlipidemia: Secondary | ICD-10-CM | POA: Diagnosis not present

## 2018-11-08 DIAGNOSIS — M67949 Unspecified disorder of synovium and tendon, unspecified hand: Secondary | ICD-10-CM | POA: Diagnosis not present

## 2018-11-08 DIAGNOSIS — Z716 Tobacco abuse counseling: Secondary | ICD-10-CM | POA: Diagnosis not present

## 2018-11-08 DIAGNOSIS — F17210 Nicotine dependence, cigarettes, uncomplicated: Secondary | ICD-10-CM | POA: Diagnosis not present

## 2018-11-08 DIAGNOSIS — E039 Hypothyroidism, unspecified: Secondary | ICD-10-CM | POA: Diagnosis not present

## 2018-12-06 DIAGNOSIS — Z716 Tobacco abuse counseling: Secondary | ICD-10-CM | POA: Diagnosis not present

## 2018-12-06 DIAGNOSIS — F17210 Nicotine dependence, cigarettes, uncomplicated: Secondary | ICD-10-CM | POA: Diagnosis not present

## 2018-12-29 DIAGNOSIS — E039 Hypothyroidism, unspecified: Secondary | ICD-10-CM | POA: Diagnosis not present

## 2018-12-29 DIAGNOSIS — E782 Mixed hyperlipidemia: Secondary | ICD-10-CM | POA: Diagnosis not present

## 2018-12-29 DIAGNOSIS — Z716 Tobacco abuse counseling: Secondary | ICD-10-CM | POA: Diagnosis not present

## 2018-12-29 DIAGNOSIS — Z008 Encounter for other general examination: Secondary | ICD-10-CM | POA: Diagnosis not present

## 2018-12-29 DIAGNOSIS — F17210 Nicotine dependence, cigarettes, uncomplicated: Secondary | ICD-10-CM | POA: Diagnosis not present

## 2019-01-12 DIAGNOSIS — E039 Hypothyroidism, unspecified: Secondary | ICD-10-CM | POA: Diagnosis not present

## 2019-01-12 DIAGNOSIS — Z716 Tobacco abuse counseling: Secondary | ICD-10-CM | POA: Diagnosis not present

## 2019-01-12 DIAGNOSIS — F17210 Nicotine dependence, cigarettes, uncomplicated: Secondary | ICD-10-CM | POA: Diagnosis not present

## 2019-06-01 DIAGNOSIS — Z0130 Encounter for examination of blood pressure without abnormal findings: Secondary | ICD-10-CM | POA: Diagnosis not present

## 2019-06-01 DIAGNOSIS — E039 Hypothyroidism, unspecified: Secondary | ICD-10-CM | POA: Diagnosis not present

## 2019-06-01 DIAGNOSIS — E782 Mixed hyperlipidemia: Secondary | ICD-10-CM | POA: Diagnosis not present

## 2019-06-01 DIAGNOSIS — Z139 Encounter for screening, unspecified: Secondary | ICD-10-CM | POA: Diagnosis not present

## 2019-06-20 DIAGNOSIS — M79671 Pain in right foot: Secondary | ICD-10-CM | POA: Diagnosis not present

## 2019-07-18 DIAGNOSIS — R7989 Other specified abnormal findings of blood chemistry: Secondary | ICD-10-CM | POA: Diagnosis not present

## 2019-08-08 DIAGNOSIS — R7989 Other specified abnormal findings of blood chemistry: Secondary | ICD-10-CM | POA: Diagnosis not present

## 2019-09-07 DIAGNOSIS — Z23 Encounter for immunization: Secondary | ICD-10-CM | POA: Diagnosis not present

## 2019-09-20 DIAGNOSIS — B078 Other viral warts: Secondary | ICD-10-CM | POA: Diagnosis not present

## 2019-09-21 DIAGNOSIS — R42 Dizziness and giddiness: Secondary | ICD-10-CM | POA: Diagnosis not present

## 2019-09-21 DIAGNOSIS — F17210 Nicotine dependence, cigarettes, uncomplicated: Secondary | ICD-10-CM | POA: Diagnosis not present

## 2019-09-21 DIAGNOSIS — Z716 Tobacco abuse counseling: Secondary | ICD-10-CM | POA: Diagnosis not present

## 2019-09-21 DIAGNOSIS — F338 Other recurrent depressive disorders: Secondary | ICD-10-CM | POA: Diagnosis not present

## 2019-10-24 DIAGNOSIS — F17210 Nicotine dependence, cigarettes, uncomplicated: Secondary | ICD-10-CM | POA: Diagnosis not present

## 2019-10-24 DIAGNOSIS — F338 Other recurrent depressive disorders: Secondary | ICD-10-CM | POA: Diagnosis not present

## 2019-10-24 DIAGNOSIS — Z716 Tobacco abuse counseling: Secondary | ICD-10-CM | POA: Diagnosis not present

## 2020-01-03 ENCOUNTER — Encounter: Payer: Self-pay | Admitting: Family

## 2020-01-03 ENCOUNTER — Ambulatory Visit: Payer: BC Managed Care – PPO | Admitting: Family

## 2020-01-03 ENCOUNTER — Other Ambulatory Visit: Payer: Self-pay

## 2020-01-03 VITALS — BP 151/101 | HR 101 | Temp 96.0°F | Ht 68.0 in | Wt 110.8 lb

## 2020-01-03 DIAGNOSIS — M542 Cervicalgia: Secondary | ICD-10-CM

## 2020-01-03 DIAGNOSIS — S139XXA Sprain of joints and ligaments of unspecified parts of neck, initial encounter: Secondary | ICD-10-CM | POA: Diagnosis not present

## 2020-01-03 MED ORDER — PREDNISONE 10 MG (21) PO TBPK: ORAL_TABLET | ORAL | 0 refills | Status: DC

## 2020-01-03 MED ORDER — DICLOFENAC SODIUM 75 MG PO TBEC: 75.0000 mg | DELAYED_RELEASE_TABLET | Freq: Two times a day (BID) | ORAL | 1 refills | Status: DC

## 2020-01-03 MED ORDER — BACLOFEN 10 MG PO TABS
10.0000 mg | ORAL_TABLET | Freq: Three times a day (TID) | ORAL | 0 refills | Status: DC
Start: 2020-01-03 — End: 2020-08-27

## 2020-01-03 NOTE — Progress Notes (Signed)
Subjective:    Patient ID: [name], female    DOB: 12/03/1960, 59 y.o.   MRN: [id number]  Chief Complaint  Patient presents with  . Neck Pain    been going on for week shooting pains to head. Patient has taken tylenol with no relief.    Neck Pain  This is a new problem. The current episode started 1 to 4 weeks ago. The problem occurs constantly. The problem has been rapidly worsening. The pain is associated with nothing. The pain is present in the left side. The quality of the pain is described as aching. The pain is at a severity of 7/10. The pain is moderate. The symptoms are aggravated by coughing, twisting and position. Pertinent negatives include no fever, headaches, leg pain, photophobia, trouble swallowing, visual change or weakness. She has tried heat, bed rest and acetaminophen for the symptoms. The treatment provided mild relief.      Review of Systems  Constitutional: Negative for fever.  HENT: Negative for trouble swallowing.   Eyes: Negative for photophobia.  Musculoskeletal: Positive for neck pain.  Neurological: Negative for weakness and headaches.  All other systems reviewed and are negative.      Objective:   Physical Exam Vitals reviewed.  Constitutional:      General: She is not in acute distress.    Appearance: She is well-developed.  HENT:     Head: Normocephalic and atraumatic.     Right Ear: Tympanic membrane normal.     Left Ear: Tympanic membrane normal.  Eyes:     Pupils: Pupils are equal, round, and reactive to light.  Neck:     Thyroid: No thyromegaly.     Comments: Pain in left posterior neck with left rotation, unable to turn greater than 25 degrees, pain in left neck with flexion Cardiovascular:     Rate and Rhythm: Normal rate and regular rhythm.     Heart sounds: Normal heart sounds. No murmur.  Pulmonary:     Effort: Pulmonary effort is normal. No respiratory distress.     Breath sounds: Normal breath sounds. No wheezing.    Abdominal:     General: Bowel sounds are normal. There is no distension.     Palpations: Abdomen is soft.     Tenderness: There is no abdominal tenderness.  Musculoskeletal:        General: No tenderness. Normal range of motion.     Cervical back: Neck supple.  Skin:    General: Skin is warm and dry.  Neurological:     Mental Status: She is alert and oriented to person, place, and time.     Cranial Nerves: No cranial nerve deficit.     Deep Tendon Reflexes: Reflexes are normal and symmetric.  Psychiatric:        Behavior: Behavior normal.        Thought Content: Thought content normal.        Judgment: Judgment normal.     BP (!) 151/101   Pulse (!) 101   Temp (!) 96 F (35.6 C) (Temporal)   Ht 5\' 8"  (1.727 m)   Wt 110 lb 12.8 oz (50.3 kg)   SpO2 98%   BMI 16.85 kg/m        Assessment & Plan:  [name] comes in today with chief complaint of Neck Pain (been going on for week shooting pains to head. Patient has taken tylenol with no relief.)   Diagnosis and orders addressed:  1.  Neck pain - predniSONE (STERAPRED UNI-PAK 21 TAB) 10 MG (21) TBPK tablet; Use as directed  Dispense: 21 tablet; Refill: 0 - diclofenac (VOLTAREN) 75 MG EC tablet; Take 1 tablet (75 mg total) by mouth 2 (two) times daily.  Dispense: 60 tablet; Refill: 1 - baclofen (LIORESAL) 10 MG tablet; Take 1 tablet (10 mg total) by mouth 3 (three) times daily.  Dispense: 30 each; Refill: 0  2. Neck sprain, initial encounter - predniSONE (STERAPRED UNI-PAK 21 TAB) 10 MG (21) TBPK tablet; Use as directed  Dispense: 21 tablet; Refill: 0 - diclofenac (VOLTAREN) 75 MG EC tablet; Take 1 tablet (75 mg total) by mouth 2 (two) times daily.  Dispense: 60 tablet; Refill: 1 - baclofen (LIORESAL) 10 MG tablet; Take 1 tablet (10 mg total) by mouth 3 (three) times daily.  Dispense: 30 each; Refill: 0  Rest Ice ROM exercises encouraged No other NSAID's while taking Diclofenac  Sedation precautions discussed Work  note given RTO if symptoms worsen or do not improve   Evelina Dun, FNP

## 2020-01-03 NOTE — Patient Instructions (Signed)

## 2020-04-16 ENCOUNTER — Telehealth (INDEPENDENT_AMBULATORY_CARE_PROVIDER_SITE_OTHER): Payer: BC Managed Care – PPO | Admitting: Nurse Practitioner

## 2020-04-16 ENCOUNTER — Encounter: Payer: Self-pay | Admitting: Nurse Practitioner

## 2020-04-16 DIAGNOSIS — M67479 Ganglion, unspecified ankle and foot: Secondary | ICD-10-CM | POA: Diagnosis not present

## 2020-04-16 NOTE — Progress Notes (Signed)
   Virtual Visit via video Note   Due to COVID-19 pandemic this visit was conducted virtually. This visit type was conducted due to national recommendations for restrictions regarding the COVID-19 Pandemic (e.g. social distancing, sheltering in place) in an effort to limit this patient's exposure and mitigate transmission in our community. All issues noted in this document were discussed and addressed.  A physical exam was not performed with this format.  I connected with  [name]  on 04/16/20 at 1:20 by video and verified that I am speaking with the correct person using two identifiers. [name] is currently located at home and no one  is currently with her during visit. The provider, Mary-Margaret Hassell Done, FNP is located in their office at time of visit.  I discussed the limitations, risks, security and privacy concerns of performing an evaluation and management service by telephone and the availability of in person appointments. I also discussed with the patient that there may be a patient responsible charge related to this service. The patient expressed understanding and agreed to proceed.   History and Present Illness:   Chief Complaint: left toe pain  HPI Patient calls in c/o of a knot at the base of her left second toe. Ay it I tender to the touch and hurts to walk. When she walks it end hooting pain up her foot. If she is  not putting pressure on it , it I ok.   Review of Systems  Constitutional: Negative for chills, diaphoresis, fever and weight loss.  Eyes: Negative for blurred vision, double vision and pain.  Respiratory: Negative for shortness of breath.   Cardiovascular: Negative for chest pain, palpitations, orthopnea and leg swelling.  Gastrointestinal: Negative for abdominal pain.  Musculoskeletal: Positive for joint pain (left 2nd toe).  Skin: Negative for rash.  Neurological: Negative for dizziness, sensory change, loss of consciousness, weakness and  headaches.  Endo/Heme/Allergies: Negative for polydipsia. Does not bruise/bleed easily.  Psychiatric/Behavioral: Negative for memory loss. The patient does not have insomnia.   All other systems reviewed and are negative.      Observations/Objective: Alert and oriented- answers all questions appropriately mild distress Nodule noted at base of 2nd left toe. Pain with palpation. FROM of toe with hooting pian on flexion of toe- as demontrtaed by patient   Assessment and Plan: [name] in today with chief complaint of Toe Pain   1. Ganglion cyst of foot Ice bid Motrin OTC every 6 hour Out of work till see ortho - Ambulatory referral to Orthopedic Surgery     Follow Up Instructions: prn    I discussed the assessment and treatment plan with the patient. The patient was provided an opportunity to ask questions and all were answered. The patient agreed with the plan and demonstrated an understanding of the instructions.   The patient was advised to call back or seek an in-person evaluation if the symptoms worsen or if the condition fails to improve as anticipated.  The above assessment and management plan was discussed with the patient. The patient verbalized understanding of and has agreed to the management plan. Patient is aware to call the clinic if symptoms persist or worsen. Patient is aware when to return to the clinic for a follow-up visit. Patient educated on when it is appropriate to go to the emergency department.   Time call ended:1:36  I provided 16 minutes of face-to-face time during this encounter.    Mary-Margaret Hassell Done, FNP

## 2020-04-17 DIAGNOSIS — M79671 Pain in right foot: Secondary | ICD-10-CM | POA: Diagnosis not present

## 2020-06-01 DIAGNOSIS — S93401A Sprain of unspecified ligament of right ankle, initial encounter: Secondary | ICD-10-CM | POA: Diagnosis not present

## 2020-06-01 DIAGNOSIS — Z681 Body mass index (BMI) 19 or less, adult: Secondary | ICD-10-CM | POA: Diagnosis not present

## 2020-06-29 ENCOUNTER — Other Ambulatory Visit: Payer: Self-pay

## 2020-06-29 ENCOUNTER — Emergency Department (HOSPITAL_COMMUNITY)
Admission: EM | Admit: 2020-06-29 | Discharge: 2020-06-29 | Disposition: A | Discharge status: Home / Self Care | Payer: No Typology Code available for payment source | Attending: Emergency Medicine | Admitting: Emergency Medicine

## 2020-06-29 ENCOUNTER — Emergency Department (HOSPITAL_COMMUNITY): Payer: No Typology Code available for payment source

## 2020-06-29 ENCOUNTER — Encounter (HOSPITAL_COMMUNITY): Payer: Self-pay

## 2020-06-29 ENCOUNTER — Encounter (HOSPITAL_COMMUNITY): Payer: Self-pay | Admitting: Orthopedic Surgery

## 2020-06-29 DIAGNOSIS — E0789 Other specified disorders of thyroid: Secondary | ICD-10-CM | POA: Diagnosis not present

## 2020-06-29 DIAGNOSIS — F17210 Nicotine dependence, cigarettes, uncomplicated: Secondary | ICD-10-CM | POA: Insufficient documentation

## 2020-06-29 DIAGNOSIS — S68622A Partial traumatic transphalangeal amputation of right middle finger, initial encounter: Secondary | ICD-10-CM | POA: Insufficient documentation

## 2020-06-29 DIAGNOSIS — Z23 Encounter for immunization: Secondary | ICD-10-CM | POA: Insufficient documentation

## 2020-06-29 DIAGNOSIS — S68119A Complete traumatic metacarpophalangeal amputation of unspecified finger, initial encounter: Secondary | ICD-10-CM

## 2020-06-29 DIAGNOSIS — Y9389 Activity, other specified: Secondary | ICD-10-CM | POA: Diagnosis not present

## 2020-06-29 DIAGNOSIS — Z20822 Contact with and (suspected) exposure to covid-19: Secondary | ICD-10-CM | POA: Insufficient documentation

## 2020-06-29 DIAGNOSIS — Y990 Civilian activity done for income or pay: Secondary | ICD-10-CM | POA: Diagnosis not present

## 2020-06-29 DIAGNOSIS — Y9289 Other specified places as the place of occurrence of the external cause: Secondary | ICD-10-CM | POA: Diagnosis not present

## 2020-06-29 DIAGNOSIS — X58XXXA Exposure to other specified factors, initial encounter: Secondary | ICD-10-CM | POA: Insufficient documentation

## 2020-06-29 DIAGNOSIS — Y999 Unspecified external cause status: Secondary | ICD-10-CM | POA: Diagnosis not present

## 2020-06-29 LAB — CBC WITH DIFFERENTIAL/PLATELET
Abs Immature Granulocytes: 0.01 10*3/uL (ref 0.00–0.07)
Basophils Absolute: 0 10*3/uL (ref 0.0–0.1)
Basophils Relative: 0 %
Eosinophils Absolute: 0 10*3/uL (ref 0.0–0.5)
Eosinophils Relative: 0 %
HCT: 43.2 % (ref 36.0–46.0)
Hemoglobin: 14.6 g/dL (ref 12.0–15.0)
Immature Granulocytes: 0 %
Lymphocytes Relative: 20 %
Lymphs Abs: 1.6 10*3/uL (ref 0.7–4.0)
MCH: 31.2 pg (ref 26.0–34.0)
MCHC: 33.8 g/dL (ref 30.0–36.0)
MCV: 92.3 fL (ref 80.0–100.0)
Monocytes Absolute: 0.5 10*3/uL (ref 0.1–1.0)
Monocytes Relative: 7 %
Neutro Abs: 5.7 10*3/uL (ref 1.7–7.7)
Neutrophils Relative %: 73 %
Platelets: 297 10*3/uL (ref 150–400)
RBC: 4.68 MIL/uL (ref 3.87–5.11)
RDW: 12.7 % (ref 11.5–15.5)
WBC: 7.8 10*3/uL (ref 4.0–10.5)
nRBC: 0 % (ref 0.0–0.2)

## 2020-06-29 LAB — BASIC METABOLIC PANEL
Anion gap: 11 (ref 5–15)
BUN: 9 mg/dL (ref 6–20)
CO2: 26 mmol/L (ref 22–32)
Calcium: 9.3 mg/dL (ref 8.9–10.3)
Chloride: 102 mmol/L (ref 98–111)
Creatinine, Ser: 0.84 mg/dL (ref 0.44–1.00)
GFR calc Af Amer: >60 mL/min (ref >60)
GFR calc non Af Amer: >60 mL/min (ref >60)
Glucose, Bld: 119 mg/dL — ABNORMAL HIGH (ref 70–99)
Potassium: 3.9 mmol/L (ref 3.5–5.1)
Sodium: 139 mmol/L (ref 135–145)

## 2020-06-29 LAB — SARS CORONAVIRUS 2 BY RT PCR (HOSPITAL ORDER, PERFORMED IN ~~LOC~~ HOSPITAL LAB): SARS Coronavirus 2: NEGATIVE

## 2020-06-29 MED ORDER — OXYCODONE-ACETAMINOPHEN 5-325 MG PO TABS: 1.0000 | ORAL_TABLET | Freq: Four times a day (QID) | ORAL | 0 refills | Status: DC | PRN

## 2020-06-29 MED ORDER — ONDANSETRON 4 MG PO TBDP: 4.0000 mg | ORAL_TABLET | Freq: Three times a day (TID) | ORAL | 0 refills | Status: DC | PRN

## 2020-06-29 MED ORDER — DOXYCYCLINE HYCLATE 100 MG PO CAPS: 100.0000 mg | ORAL_CAPSULE | Freq: Two times a day (BID) | ORAL | 0 refills | Status: AC

## 2020-06-29 MED ORDER — TETANUS-DIPHTH-ACELL PERTUSSIS 5-2.5-18.5 LF-MCG/0.5 IM SUSP
0.5000 mL | Freq: Once | INTRAMUSCULAR | Status: AC
  Administered 2020-06-29: 0.5 mL via INTRAMUSCULAR
  Filled 2020-06-29: qty 0.5

## 2020-06-29 MED ORDER — SENNOSIDES-DOCUSATE SODIUM 8.6-50 MG PO TABS
1.0000 | ORAL_TABLET | Freq: Every evening | ORAL | 0 refills | Status: DC | PRN
Start: 2020-06-29 — End: 2020-08-27

## 2020-06-29 MED ORDER — CEFAZOLIN SODIUM 1 G IJ SOLR: 2.0000 g | Freq: Once | INTRAMUSCULAR | Status: DC

## 2020-06-29 MED ORDER — ONDANSETRON HCL 4 MG/2ML IJ SOLN
4.0000 mg | Freq: Once | INTRAMUSCULAR | Status: AC
  Administered 2020-06-29: 4 mg via INTRAVENOUS
  Filled 2020-06-29: qty 2

## 2020-06-29 MED ORDER — MORPHINE SULFATE (PF) 4 MG/ML IV SOLN
4.0000 mg | Freq: Once | INTRAVENOUS | Status: AC
  Administered 2020-06-29: 4 mg via INTRAVENOUS
  Filled 2020-06-29: qty 1

## 2020-06-29 MED ORDER — CEFAZOLIN SODIUM-DEXTROSE 2-4 GM/100ML-% IV SOLN
2.0000 g | Freq: Once | INTRAVENOUS | Status: AC
  Administered 2020-06-29: 2 g via INTRAVENOUS
  Filled 2020-06-29: qty 100

## 2020-06-29 NOTE — Pre-Procedure Instructions (Signed)
    [name]  06/29/2020     Your procedure is scheduled on Saturday, June 30, 2020  Report to Emergency Department to check in at 5:50 A.M. ( Staff to escort you to  The Short Stay Unit).   Call this number if you have problems the morning of surgery:  [phone]   Remember: Brush your teeth the morning of surgery.   Do not eat or drink after midnight tonight    Take these medicines the morning of surgery with A SIP OF WATER :   levothyroxine (SYNTHROID, LEVOTHROID)  loratadine (CLARITIN)   venlafaxine (EFFEXOR)  fluticasone (FLONASE) 50 MCG/ACT nasal spray  Stop taking Aspirin (unless otherwise advised by surgeon), vitamins, fish oil and herbal medications. Do not take any NSAIDs ie: Ibuprofen, Advil, Naproxen (Aleve), Motrin, BC and Goody Powder; stop now.  Do not wear jewelry, make-up or nail polish.  Do not wear lotions, powders, or perfumes, or deodorant.  Do not shave 48 hours prior to surgery.   Do not bring valuables to the hospital.  [facility] is not responsible for any belongings or valuables.  Contacts, dentures or bridgework may not be worn into surgery.  Leave your suitcase in the car.  After surgery it may be brought to your room.  For patients admitted to the hospital, discharge time will be determined by your treatment team.  Patients discharged the day of surgery will not be allowed to drive home.  Special instructions: Wash with an Antibacterial Soap for surgery.  Please read over the following fact sheets that you were given.

## 2020-06-29 NOTE — H&P (Signed)
[name] is an 59 y.o. female.   Chief Complaint: Right long finger amputation HPI: [name] is a 59 y.o. female with past medical history reviewed below presents to the emergency department with right middle fingertip amputation while at work.  Patient states that she was working with a machine and and pulled off a piece of yarn attached to what sounds like a metal spindle.  The yarn wrapped tightly around the tip of her finger and ultimately amputated her right middle fingertip.  She did not bring the amputated tip with her.  She sustained an abrasion to the adjacent finger but no laceration.  She is unsure regarding her last tetanus shot.  She has not been vaccinated against Covid.  She initially had numbness in the area of the finger and now has severe, throbbing pain.   Past Medical History:  Diagnosis Date  . Cancer ([place]) 2012   Breast  . Cervical muscle strain   . Headache(784.0)   . Lumbar strain   . Thyroid disease   . Wears contact lenses   . Wears dentures    upper    Past Surgical History:  Procedure Laterality Date  . BREAST SURGERY  04/10/2011   Right mastectomy and slnbx  . LYMPH NODE BIOPSY  1996   rt groin  . MULTIPLE TOOTH EXTRACTIONS    . PORT-A-CATH REMOVAL  2012   insertion and out  . RADIOACTIVE SEED GUIDED EXCISIONAL BREAST BIOPSY Left 01/16/2015   Procedure: RADIOACTIVE SEED GUIDED EXCISIONAL BREAST BIOPSY;  Surgeon: Alphonsa Overall, MD;  Location: [facility];  Service: General;  Laterality: Left;  . TUBAL LIGATION      Family History  Problem Relation Age of Onset  . Stroke Mother   . Hypertension Mother   . Heart disease Mother   . Cancer Maternal Grandmother        Breast cancer   Social History:  reports that she has been smoking cigarettes. She has a 15.00 pack-year smoking history. She has never used smokeless tobacco. She reports current alcohol use of about 2.0 standard drinks of alcohol per week. She reports that she does  not use drugs.  Allergies:  Allergies  Allergen Reactions  . Sulfa Antibiotics Other (See Comments)    Makes mouth raw  . Codeine Nausea And Vomiting    No medications prior to admission.    Results for orders placed or performed during the hospital encounter of 06/29/20 (from the past 48 hour(s))  SARS Coronavirus 2 by RT PCR (hospital order, performed in [facility] hospital lab) Nasopharyngeal Nasopharyngeal Swab     Status: None   Collection Time: 06/29/20 11:04 AM   Specimen: Nasopharyngeal Swab  Result Value Ref Range   SARS Coronavirus 2 NEGATIVE NEGATIVE    Comment: (NOTE) SARS-CoV-2 target nucleic acids are NOT DETECTED.  The SARS-CoV-2 RNA is generally detectable in upper and lower respiratory specimens during the acute phase of infection. The lowest concentration of SARS-CoV-2 viral copies this assay can detect is 250 copies / mL. A negative result does not preclude SARS-CoV-2 infection and should not be used as the sole basis for treatment or other patient management decisions.  A negative result may occur with improper specimen collection / handling, submission of specimen other than nasopharyngeal swab, presence of viral mutation(s) within the areas targeted by this assay, and inadequate number of viral copies (<250 copies / mL). A negative result must be combined with clinical observations, patient history,  and epidemiological information.  Fact Sheet for Patients:   StrictlyIdeas.no  Fact Sheet for Healthcare Providers: BankingDealers.co.za  This test is not yet approved or  cleared by the [place] FDA and has been authorized for detection and/or diagnosis of SARS-CoV-2 by FDA under an Emergency Use Authorization (EUA).  This EUA will remain in effect (meaning this test can be used) for the duration of the COVID-19 declaration under Section 564(b)(1) of the Act, 21 U.S.C. section 360bbb-3(b)(1), unless the  authorization is terminated or revoked sooner.  Performed at [facility], [address], [place], [place] [address]   Basic metabolic panel     Status: Abnormal   Collection Time: 06/29/20  1:19 PM  Result Value Ref Range   Sodium 139 135 - 145 mmol/L   Potassium 3.9 3.5 - 5.1 mmol/L   Chloride 102 98 - 111 mmol/L   CO2 26 22 - 32 mmol/L   Glucose, Bld 119 (H) 70 - 99 mg/dL    Comment: Glucose reference range applies only to samples taken after fasting for at least 8 hours.   BUN 9 6 - 20 mg/dL   Creatinine, Ser 0.84 0.44 - 1.00 mg/dL   Calcium 9.3 8.9 - 10.3 mg/dL   GFR calc non Af Amer >60 >60 mL/min   GFR calc Af Amer >60 >60 mL/min   Anion gap 11 5 - 15    Comment: Performed at [facility], [address], [place], [place] [address]  CBC with Differential     Status: None   Collection Time: 06/29/20  1:19 PM  Result Value Ref Range   WBC 7.8 4.0 - 10.5 K/uL   RBC 4.68 3.87 - 5.11 MIL/uL   Hemoglobin 14.6 12.0 - 15.0 g/dL   HCT 43.2 36 - 46 %   MCV 92.3 80.0 - 100.0 fL   MCH 31.2 26.0 - 34.0 pg   MCHC 33.8 30.0 - 36.0 g/dL   RDW 12.7 11.5 - 15.5 %   Platelets 297 150 - 400 K/uL   nRBC 0.0 0.0 - 0.2 %   Neutrophils Relative % 73 %   Neutro Abs 5.7 1.7 - 7.7 K/uL   Lymphocytes Relative 20 %   Lymphs Abs 1.6 0.7 - 4.0 K/uL   Monocytes Relative 7 %   Monocytes Absolute 0.5 0 - 1 K/uL   Eosinophils Relative 0 %   Eosinophils Absolute 0.0 0 - 0 K/uL   Basophils Relative 0 %   Basophils Absolute 0.0 0 - 0 K/uL   Immature Granulocytes 0 %   Abs Immature Granulocytes 0.01 0.00 - 0.07 K/uL    Comment: Performed at [facility], [address] [place], [place] [address]   DG Finger Middle Right  Result Date: 06/29/2020 CLINICAL DATA:  Amputation/work related injury EXAM: RIGHT THIRD FINGER 2+V COMPARISON:  None. FINDINGS: Frontal, oblique, and lateral views were obtained. There is an overlying bandage. There is been amputation at the level of the midportion of the third  distal phalanx. Proximal to the level of amputation, there is a comminuted fracture with mild impaction at the fracture site. No fracture or dislocation involving bony structures other than the third distal phalanx. No appreciable joint space narrowing or erosion. IMPRESSION: Amputation through the midportion of the third distal phalanx. Proximal to this amputation, there is a comminuted fracture with mild impaction at the fracture site involving the proximal aspect of the third distal phalanx. Overlying bandage. No other radiopaque foreign body beyond bandage. No  fractures or dislocations apart from the abnormalities in the third distal phalanx. No appreciable arthropathy. Electronically Signed   By: Lowella Grip III M.D.   On: 06/29/2020 11:24    ROS NO RECENT ILLNESSES OR HOSPITALIZATIONS  There were no vitals taken for this visit. Physical Exam  General Appearance:  Alert, cooperative, no distress, appears stated age  Head:  Normocephalic, without obvious abnormality, atraumatic  Eyes:  Pupils equal, conjunctiva/corneas clear,         Throat: Lips, mucosa, and tongue normal; teeth and gums normal  Neck: No visible masses     Lungs:   respirations unlabored  Chest Wall:  No tenderness or deformity  Heart:  Regular rate and rhythm,  Abdomen:   Soft, non-tender,         Extremities: RIGHT LONG FINGER WITH BANDAGE IN PLACE, NO WOUNDS TO INDEX/RING/SMALL FINGERS WARM WELL PERFUSED  Pulses: 2+ and symmetric  Skin: Skin color, texture, turgor normal, no rashes or lesions     Neurologic: Normal    Assessment/Plan RIGHT MIDDLE FINGER DISTAL AMPUTATION  RIGHT MIDDLE FINGER REVISION AMPUTATION  R/B/A DISCUSSED WITH PT IN OFFICE.  PT VOICED UNDERSTANDING OF PLAN CONSENT SIGNED DAY OF SURGERY PT SEEN AND EXAMINED PRIOR TO OPERATIVE PROCEDURE/DAY OF SURGERY SITE MARKED. QUESTIONS ANSWERED WILL GO HOME FOLLOWING SURGERY  WE ARE PLANNING SURGERY FOR YOUR UPPER EXTREMITY. THE RISKS  AND BENEFITS OF SURGERY INCLUDE BUT NOT LIMITED TO BLEEDING INFECTION, DAMAGE TO NEARBY NERVES ARTERIES TENDONS, FAILURE OF SURGERY TO ACCOMPLISH ITS INTENDED GOALS, PERSISTENT SYMPTOMS AND NEED FOR FURTHER SURGICAL INTERVENTION. WITH THIS IN MIND WE WILL PROCEED. I HAVE DISCUSSED WITH THE PATIENT THE PRE AND POSTOPERATIVE REGIMEN AND THE DOS AND DON'TS. PT VOICED UNDERSTANDING AND INFORMED CONSENT SIGNED.  Janelle Floor [facility] 06/30/20 @ 0830

## 2020-06-29 NOTE — Discharge Instructions (Signed)
You were seen in the emergency department today after amputation of your right middle fingertip.  We have placed a dressing which you should keep in place until seen by the surgeon tomorrow.  You will be meeting Dr. Apolonio Schneiders at [facility] tomorrow morning before your surgery which is scheduled for 7:30 AM.  The OR team would like for you to arrive by 6 AM to the [facility] and go in through the emergency department entrance.  Please tell them that you are therefore scheduled surgery and they will direct you to the appropriate location.  Do not eat or drink anything after midnight tonight.  I have called in some antibiotics for the next 7 days which you can pick up along with your pain medications.  Do not drink alcohol with these pain medicines.  Do not take your pain medications with anxiety medicines or other pain medicines as this can cause troubles breathing.

## 2020-06-29 NOTE — ED Triage Notes (Signed)
Pt got a piece of yarn wrapped around her middle right finger and a skin tear to right ring finger. She states bone is exposed. Unsure of tetanus.

## 2020-06-29 NOTE — ED Provider Notes (Signed)
Emergency Department Provider Note   I have reviewed the triage vital signs and the nursing notes.   HISTORY  Chief Complaint Laceration   HPI [name] is a 59 y.o. female with past medical history reviewed below presents to the emergency department with right middle fingertip amputation while at work.  Patient states that she was working with a machine and and pulled off a piece of yarn attached to what sounds like a metal spindle.  The yarn wrapped tightly around the tip of her finger and ultimately amputated her right middle fingertip.  She did not bring the amputated tip with her.  She sustained an abrasion to the adjacent finger but no laceration.  She is unsure regarding her last tetanus shot.  She has not been vaccinated against Covid.  She initially had numbness in the area of the finger and now has severe, throbbing pain.   Past Medical History:  Diagnosis Date  . Cancer ([place]) 2012   Breast  . Cervical muscle strain   . Headache(784.0)   . Lumbar strain   . Thyroid disease   . Wears contact lenses   . Wears dentures    upper    Patient Active Problem List   Diagnosis Date Noted  . Sprain of lumbosacral (joint) (ligament) 12/01/2012  . Sprain of neck 12/01/2012  . Headache 12/01/2012  . Breast cancer, right breast, Triple negative, CR to neoaduvant chemo, mastectomy 04/10/2011. 12/26/2011    Past Surgical History:  Procedure Laterality Date  . BREAST SURGERY  04/10/2011   Right mastectomy and slnbx  . LYMPH NODE BIOPSY  1996   rt groin  . MULTIPLE TOOTH EXTRACTIONS    . PORT-A-CATH REMOVAL  2012   insertion and out  . RADIOACTIVE SEED GUIDED EXCISIONAL BREAST BIOPSY Left 01/16/2015   Procedure: RADIOACTIVE SEED GUIDED EXCISIONAL BREAST BIOPSY;  Surgeon: Alphonsa Overall, MD;  Location: [facility];  Service: General;  Laterality: Left;  . TUBAL LIGATION      Allergies Sulfa antibiotics and Codeine  Family History  Problem Relation Age of  Onset  . Stroke Mother   . Hypertension Mother   . Heart disease Mother   . Cancer Maternal Grandmother        Breast cancer    Social History Social History   Tobacco Use  . Smoking status: Current Every Day Smoker    Packs/day: 0.50    Years: 30.00    Pack years: 15.00    Types: Cigarettes  . Smokeless tobacco: Never Used  Vaping Use  . Vaping Use: Never used  Substance Use Topics  . Alcohol use: Yes    Alcohol/week: 2.0 standard drinks    Types: 2 Cans of beer per week    Comment: 1-2 beers WEEKLY  . Drug use: No    Review of Systems  Constitutional: No fever/chills Eyes: No visual changes. ENT: No sore throat. Cardiovascular: Denies chest pain. Respiratory: Denies shortness of breath. Gastrointestinal: No abdominal pain.  No nausea, no vomiting.  No diarrhea.  No constipation. Genitourinary: Negative for dysuria. Musculoskeletal: Negative for back pain. Skin: Fingertip laceration with bleeding.  Neurological: Negative for headaches, focal weakness or numbness.  10-point ROS otherwise negative.  ____________________________________________   PHYSICAL EXAM:  VITAL SIGNS: ED Triage Vitals  Enc Vitals Group     BP 06/29/20 1024 (!) 197/102     Pulse Rate 06/29/20 1024 88     Resp 06/29/20 1024 12  Temp 06/29/20 1024 98.2 F (36.8 C)     Temp Source 06/29/20 1024 Oral     SpO2 06/29/20 1024 99 %     Weight 06/29/20 1023 108 lb (49 kg)     Height 06/29/20 1023 5\' 8"  (1.727 m)   Constitutional: Alert and oriented. Well appearing and in no acute distress. Eyes: Conjunctivae are normal.  Head: Atraumatic. Nose: No congestion/rhinnorhea. Mouth/Throat: Mucous membranes are moist.  Neck: No stridor.   Cardiovascular: Normal rate, regular rhythm. Good peripheral circulation. Grossly normal heart sounds.   Respiratory: Normal respiratory effort.  No retractions. Lungs CTAB. Gastrointestinal: Soft and nontender. No distention.  Musculoskeletal: The distal  most portion of the right Ronalee Scheunemann finger is amputated. The injury has occurred proximal to the nailbed. Oozing blood without arterial bleeding.  Neurologic:  Normal speech and language. Normal sensation over the remaining fingers.  Skin:  Skin is warm and dry. Fingertip amputation as below.        ____________________________________________   LABS (all labs ordered are listed, but only abnormal results are displayed)  Labs Reviewed  BASIC METABOLIC PANEL - Abnormal; Notable for the following components:      Result Value   Glucose, Bld 119 (*)    All other components within normal limits  SARS CORONAVIRUS 2 BY RT PCR (HOSPITAL ORDER, [facility])  CBC WITH DIFFERENTIAL/PLATELET   ____________________________________________  RADIOLOGY  DG Finger Middle Right  Result Date: 06/29/2020 CLINICAL DATA:  Amputation/work related injury EXAM: RIGHT THIRD FINGER 2+V COMPARISON:  None. FINDINGS: Frontal, oblique, and lateral views were obtained. There is an overlying bandage. There is been amputation at the level of the midportion of the third distal phalanx. Proximal to the level of amputation, there is a comminuted fracture with mild impaction at the fracture site. No fracture or dislocation involving bony structures other than the third distal phalanx. No appreciable joint space narrowing or erosion. IMPRESSION: Amputation through the midportion of the third distal phalanx. Proximal to this amputation, there is a comminuted fracture with mild impaction at the fracture site involving the proximal aspect of the third distal phalanx. Overlying bandage. No other radiopaque foreign body beyond bandage. No fractures or dislocations apart from the abnormalities in the third distal phalanx. No appreciable arthropathy. Electronically Signed   By: Lowella Grip III M.D.   On: 06/29/2020 11:24   DG MINI C-ARM IMAGE ONLY  Result Date: 06/30/2020 There is no interpretation for  this exam.  This order is for images obtained during a surgical procedure.  Please See "Surgeries" Tab for more information regarding the procedure.    ____________________________________________   PROCEDURES  Procedure(s) performed:   Procedures  None  ____________________________________________   INITIAL IMPRESSION / ASSESSMENT AND PLAN / ED COURSE  Pertinent labs & imaging results that were available during my care of the patient were reviewed by me and considered in my medical decision making (see chart for details).   Patient presents with amputation of the right third finger starting just proximal to the nailbed. The amputated finger did not arrive with the patient.  The finger was irrigated with 1 L sterile water and nonadherent/bluky dressing applied. Will obtain imaging, Ancef, tetanus, and discuss with hand surgery regarding amputation revision.   Spoke with Dr. Caralyn Guile regarding the injury and imaging.  Patient wound has been irrigated and nonadherent dressing placed.  After discussion Dr. Apolonio Schneiders is placed the patient on the OR schedule for tomorrow morning at 7:30 AM  at [facility].  Spoke with the OR nurse at [facility] who advises that the patient should arrive to the emergency department entrance at 6 AM tomorrow and they will direct her to the preop area.  Patient is to stay n.p.o. after midnight.  I have advised the patient to quarantine in the setting of her negative Covid PCR test today.  I have drawn preop labs.  Patient has received her tetanus as well as Ancef here in the ED.   On reevaluation the wound remains hemostatic and pain is well controlled.  Discussed the plan with the patient who is in agreement.  She will have a family member drive her home today and to surgery tomorrow.  ____________________________________________  FINAL CLINICAL IMPRESSION(S) / ED DIAGNOSES  Final diagnoses:  Amputation of tip of finger, initial encounter     MEDICATIONS GIVEN  DURING THIS VISIT:  Medications  Tdap (BOOSTRIX) injection 0.5 mL (0.5 mLs Intramuscular Given 06/29/20 1157)  morphine 4 MG/ML injection 4 mg (4 mg Intravenous Given 06/29/20 1153)  ondansetron (ZOFRAN) injection 4 mg (4 mg Intravenous Given 06/29/20 1150)  ceFAZolin (ANCEF) IVPB 2g/100 mL premix (0 g Intravenous Stopped 06/29/20 1425)     NEW OUTPATIENT MEDICATIONS STARTED DURING THIS VISIT:  Discharge Medication List as of 06/29/2020  2:09 PM    START taking these medications   Details  doxycycline (VIBRAMYCIN) 100 MG capsule Take 1 capsule (100 mg total) by mouth 2 (two) times daily for 7 days., Starting Fri 06/29/2020, Until Fri 07/06/2020, Normal    ondansetron (ZOFRAN ODT) 4 MG disintegrating tablet Take 1 tablet (4 mg total) by mouth every 8 (eight) hours as needed., Starting Fri 06/29/2020, Normal    oxyCODONE-acetaminophen (PERCOCET/ROXICET) 5-325 MG tablet Take 1 tablet by mouth every 6 (six) hours as needed for severe pain., Starting Fri 06/29/2020, Normal    senna-docusate (SENOKOT-S) 8.6-50 MG tablet Take 1 tablet by mouth at bedtime as needed for mild constipation or moderate constipation., Starting Fri 06/29/2020, Normal        Note:  This document was prepared using Dragon voice recognition software and may include unintentional dictation errors.  Nanda Quinton, MD, [facility]    Denis Koppel, [facility], MD 06/30/20 [phone]

## 2020-06-29 NOTE — Progress Notes (Signed)
Pt denies SOB, chest pain, and being under the care of a cardiologist. Pt stated that [name] is PCP. Pt denies having a stress test, echo and cardiac cath. Pt denies having an EKG and chest x ray. Pt made aware to stop taking  Aspirin (unless otherwise advised by surgeon), vitamins, fish oil and herbal medications. Do not take any NSAIDs ie: Ibuprofen, Advil, Naproxen (Aleve), Motrin, BC and Goody Powder. Pt reminded to quarantine. Pt verbalized understanding of all pre-op instructions.

## 2020-06-30 ENCOUNTER — Inpatient Hospital Stay (HOSPITAL_COMMUNITY): Payer: No Typology Code available for payment source | Admitting: Certified Registered"

## 2020-06-30 ENCOUNTER — Ambulatory Visit (HOSPITAL_COMMUNITY)
Admission: RE | Admit: 2020-06-30 | Discharge: 2020-06-30 | Disposition: A | Discharge status: Home / Self Care | Payer: No Typology Code available for payment source | Attending: Orthopedic Surgery | Admitting: Orthopedic Surgery

## 2020-06-30 ENCOUNTER — Encounter (HOSPITAL_COMMUNITY): Payer: Self-pay | Admitting: Orthopedic Surgery

## 2020-06-30 ENCOUNTER — Other Ambulatory Visit: Payer: Self-pay

## 2020-06-30 ENCOUNTER — Encounter (HOSPITAL_COMMUNITY)
Admission: RE | Disposition: A | Discharge status: Home / Self Care | Payer: Self-pay | Source: Home / Self Care | Attending: Orthopedic Surgery

## 2020-06-30 ENCOUNTER — Inpatient Hospital Stay (HOSPITAL_COMMUNITY): Payer: No Typology Code available for payment source

## 2020-06-30 DIAGNOSIS — Z885 Allergy status to narcotic agent status: Secondary | ICD-10-CM | POA: Diagnosis not present

## 2020-06-30 DIAGNOSIS — Z853 Personal history of malignant neoplasm of breast: Secondary | ICD-10-CM | POA: Insufficient documentation

## 2020-06-30 DIAGNOSIS — Z882 Allergy status to sulfonamides status: Secondary | ICD-10-CM | POA: Insufficient documentation

## 2020-06-30 DIAGNOSIS — F17210 Nicotine dependence, cigarettes, uncomplicated: Secondary | ICD-10-CM | POA: Insufficient documentation

## 2020-06-30 DIAGNOSIS — S68622A Partial traumatic transphalangeal amputation of right middle finger, initial encounter: Secondary | ICD-10-CM | POA: Insufficient documentation

## 2020-06-30 DIAGNOSIS — X58XXXA Exposure to other specified factors, initial encounter: Secondary | ICD-10-CM | POA: Insufficient documentation

## 2020-06-30 DIAGNOSIS — S68119A Complete traumatic metacarpophalangeal amputation of unspecified finger, initial encounter: Secondary | ICD-10-CM

## 2020-06-30 HISTORY — PX: AMPUTATION: SHX166

## 2020-06-30 HISTORY — DX: Presence of spectacles and contact lenses: Z97.3

## 2020-06-30 SURGERY — AMPUTATION DIGIT
Anesthesia: General | Laterality: Right

## 2020-06-30 MED ORDER — CEFAZOLIN SODIUM-DEXTROSE 2-4 GM/100ML-% IV SOLN
2.0000 g | INTRAVENOUS | Status: AC
  Administered 2020-06-30: 2 g via INTRAVENOUS
  Filled 2020-06-30: qty 100

## 2020-06-30 MED ORDER — PROPOFOL 10 MG/ML IV BOLUS
INTRAVENOUS | Status: DC | PRN
  Administered 2020-06-30: 140 mg via INTRAVENOUS

## 2020-06-30 MED ORDER — PROPOFOL 10 MG/ML IV BOLUS
INTRAVENOUS | Status: AC
  Filled 2020-06-30: qty 20

## 2020-06-30 MED ORDER — PHENYLEPHRINE HCL (PRESSORS) 10 MG/ML IV SOLN
INTRAVENOUS | Status: AC
  Filled 2020-06-30: qty 1

## 2020-06-30 MED ORDER — OXYCODONE-ACETAMINOPHEN 5-325 MG PO TABS: 1.0000 | ORAL_TABLET | ORAL | 0 refills | Status: AC | PRN

## 2020-06-30 MED ORDER — MIDAZOLAM HCL 2 MG/2ML IJ SOLN
INTRAMUSCULAR | Status: AC
  Filled 2020-06-30: qty 2

## 2020-06-30 MED ORDER — ROCURONIUM BROMIDE 10 MG/ML (PF) SYRINGE
PREFILLED_SYRINGE | INTRAVENOUS | Status: AC
  Filled 2020-06-30: qty 10

## 2020-06-30 MED ORDER — ONDANSETRON HCL 4 MG/2ML IJ SOLN: 4.0000 mg | Freq: Once | INTRAMUSCULAR | Status: DC | PRN

## 2020-06-30 MED ORDER — LIDOCAINE 2% (20 MG/ML) 5 ML SYRINGE
INTRAMUSCULAR | Status: AC
  Filled 2020-06-30: qty 5

## 2020-06-30 MED ORDER — DEXAMETHASONE SODIUM PHOSPHATE 10 MG/ML IJ SOLN
INTRAMUSCULAR | Status: DC | PRN
  Administered 2020-06-30: 4 mg via INTRAVENOUS

## 2020-06-30 MED ORDER — BUPIVACAINE HCL (PF) 0.25 % IJ SOLN
INTRAMUSCULAR | Status: AC
  Filled 2020-06-30: qty 30

## 2020-06-30 MED ORDER — LACTATED RINGERS IV SOLN
INTRAVENOUS | Status: DC | PRN
Start: 2020-06-30 — End: 2020-06-30

## 2020-06-30 MED ORDER — DEXAMETHASONE SODIUM PHOSPHATE 10 MG/ML IJ SOLN
INTRAMUSCULAR | Status: AC
  Filled 2020-06-30: qty 1

## 2020-06-30 MED ORDER — ONDANSETRON HCL 4 MG/2ML IJ SOLN
INTRAMUSCULAR | Status: AC
  Filled 2020-06-30: qty 2

## 2020-06-30 MED ORDER — FENTANYL CITRATE (PF) 250 MCG/5ML IJ SOLN
INTRAMUSCULAR | Status: AC
  Filled 2020-06-30: qty 5

## 2020-06-30 MED ORDER — HYDROMORPHONE HCL 1 MG/ML IJ SOLN: 0.2500 mg | INTRAMUSCULAR | Status: DC | PRN

## 2020-06-30 MED ORDER — FENTANYL CITRATE (PF) 100 MCG/2ML IJ SOLN
INTRAMUSCULAR | Status: DC | PRN
  Administered 2020-06-30: 50 ug via INTRAVENOUS
  Administered 2020-06-30 (×3): 25 ug via INTRAVENOUS

## 2020-06-30 MED ORDER — SUCCINYLCHOLINE CHLORIDE 200 MG/10ML IV SOSY
PREFILLED_SYRINGE | INTRAVENOUS | Status: AC
  Filled 2020-06-30: qty 10

## 2020-06-30 MED ORDER — MIDAZOLAM HCL 5 MG/5ML IJ SOLN
INTRAMUSCULAR | Status: DC | PRN
  Administered 2020-06-30 (×2): 1 mg via INTRAVENOUS

## 2020-06-30 MED ORDER — LIDOCAINE HCL (CARDIAC) PF 100 MG/5ML IV SOSY
PREFILLED_SYRINGE | INTRAVENOUS | Status: DC | PRN
  Administered 2020-06-30: 100 mg via INTRAVENOUS

## 2020-06-30 MED ORDER — MEPERIDINE HCL 25 MG/ML IJ SOLN: 6.2500 mg | INTRAMUSCULAR | Status: DC | PRN

## 2020-06-30 MED ORDER — CHLORHEXIDINE GLUCONATE 0.12 % MT SOLN
OROMUCOSAL | Status: AC
  Filled 2020-06-30: qty 15

## 2020-06-30 MED ORDER — 0.9 % SODIUM CHLORIDE (POUR BTL) OPTIME
TOPICAL | Status: DC | PRN
  Administered 2020-06-30: 1000 mL

## 2020-06-30 MED ORDER — ONDANSETRON HCL 4 MG/2ML IJ SOLN
INTRAMUSCULAR | Status: DC | PRN
  Administered 2020-06-30: 4 mg via INTRAVENOUS

## 2020-06-30 SURGICAL SUPPLY — 26 items
BNDG COHESIVE 1X5 TAN STRL LF (GAUZE/BANDAGES/DRESSINGS) ×2 IMPLANT
BNDG CONFORM 2 STRL LF (GAUZE/BANDAGES/DRESSINGS) ×1 IMPLANT
CORD BIPOLAR FORCEPS 12FT (ELECTRODE) ×2 IMPLANT
COVER SURGICAL LIGHT HANDLE (MISCELLANEOUS) ×2 IMPLANT
CUFF TOURN SGL QUICK 18X4 (TOURNIQUET CUFF) ×2 IMPLANT
DRAPE SURG 17X23 STRL (DRAPES) ×2 IMPLANT
DRSG EMULSION OIL 3X3 NADH (GAUZE/BANDAGES/DRESSINGS) ×1 IMPLANT
GAUZE SPONGE 2X2 8PLY STRL LF (GAUZE/BANDAGES/DRESSINGS) IMPLANT
GLOVE BIOGEL PI IND STRL 8.5 (GLOVE) ×1 IMPLANT
GLOVE BIOGEL PI INDICATOR 8.5 (GLOVE) ×1
GLOVE SURG ORTHO 8.0 STRL STRW (GLOVE) ×2 IMPLANT
GOWN STRL REUS W/ TWL LRG LVL3 (GOWN DISPOSABLE) ×2 IMPLANT
GOWN STRL REUS W/ TWL XL LVL3 (GOWN DISPOSABLE) ×1 IMPLANT
GOWN STRL REUS W/TWL LRG LVL3 (GOWN DISPOSABLE) ×2
GOWN STRL REUS W/TWL XL LVL3 (GOWN DISPOSABLE) ×2
KIT BASIN OR (CUSTOM PROCEDURE TRAY) ×2 IMPLANT
KIT TURNOVER KIT B (KITS) ×2 IMPLANT
NS IRRIG 1000ML POUR BTL (IV SOLUTION) ×2 IMPLANT
PACK ORTHO EXTREMITY (CUSTOM PROCEDURE TRAY) ×2 IMPLANT
PAD ARMBOARD 7.5X6 YLW CONV (MISCELLANEOUS) ×3 IMPLANT
SOAP 2 % CHG 4 OZ (WOUND CARE) ×2 IMPLANT
SPONGE GAUZE 2X2 STER 10/PKG (GAUZE/BANDAGES/DRESSINGS) ×1
SUT PROLENE 4 0 PS 2 18 (SUTURE) ×1 IMPLANT
TOWEL GREEN STERILE (TOWEL DISPOSABLE) ×2 IMPLANT
TOWEL GREEN STERILE FF (TOWEL DISPOSABLE) ×2 IMPLANT
WATER STERILE IRR 1000ML POUR (IV SOLUTION) ×2 IMPLANT

## 2020-06-30 NOTE — Anesthesia Procedure Notes (Addendum)
Procedure Name: LMA Insertion Date/Time: 06/30/2020 8:39 AM Performed by: Sammie Bench, CRNA Pre-anesthesia Checklist: Patient identified, Emergency Drugs available, Suction available and Patient being monitored Patient Re-evaluated:Patient Re-evaluated prior to induction Oxygen Delivery Method: Circle System Utilized Preoxygenation: Pre-oxygenation with 100% oxygen Induction Type: IV induction Ventilation: Mask ventilation without difficulty LMA: LMA inserted LMA Size: 4.0 Number of attempts: 1 Airway Equipment and Method: Bite block Placement Confirmation: positive ETCO2 Tube secured with: Tape Dental Injury: Teeth and Oropharynx as per pre-operative assessment

## 2020-06-30 NOTE — Op Note (Signed)
PREOPERATIVE DIAGNOSIS: Right long finger amputation at the level of the distal phalanx  POSTOPERATIVE DIAGNOSIS: Same  ATTENDING SURGEON: Dr. Iran Planas who scrubbed and present for the entire procedure  ASSISTANT SURGEON: None  ANESTHESIA: General via laryngeal mask airway  OPERATIVE PROCEDURE: Right long finger revision amputation to the level of the distal interphalangeal joint with local neurectomies and advancement flap closure  IMPLANTS: None  RADIOGRAPHIC INTERPRETATION: None  SURGICAL INDICATIONS: Patient is a right-hand-dominant female who sustained a work-related injury losing the tip of the long finger.  Patient was seen and evaluated and recommended to undergo the above procedure.  Risk benefits alternatives were discussed in detail with the patient and signed informed consent was obtained.  Risks include but not limited to bleeding infection damage nearby nerves arteries or tendons loss of motion of the wrist and digits incomplete relief of symptoms and need for further surgical intervention.  SURGICAL TECHNIQUE: Patient was palpated find the preoperative holding area marked apart a marker made on the right hand indicate correct operative site.  Patient brought back operating placed supine on anesthesia table where the general anesthetic was administered.  Preoperative antibiotics were given for any skin incision.  A well-padded tourniquet was then placed on the right brachium and seal with appropriate drape.  The right upper extremities then prepped and draped normal sterile fashion.  A timeout was called the correct site was identified the procedure then begun.  Attention was then turned to the long finger the amputation was through the level of the distal phalanx.  The flexor digitorum profundus was no longer attached to its distal insertion and part of the FDP was exposed has had been avulsed off the distal phalanx.  Given the small fragment and the avulsion of the FDP  amputation was then carried out to the level of the distal interphalangeal joint.  In order to obtain closure the portion distal portion of the middle phalanx was then removed to allow for advancement flap closure..  Local neurectomies were then carried out of the radial and ulnar digital nerves.  The skin flap was then advanced from a dorsal to volar direction.  Wound irrigation was done.  Skin flaps were then trimmed and then closed with simple Prolene suture.  Tourniquet deflated.  There is good perfusion of the fingertip.  Adaptic dressing sterile compressive bandage then applied.  The patient tolerated the procedure well.  POSTOPERATIVE PLAN: Patient be discharged to home.  See him back in the office in 6 days for wound check sent down to see our therapist for tip protector splint.  She will be out of work until we see her back in the office we may consider allowing her to return to light duty work after the first postoperative visit.  Radiographs of the finger at the first visit.

## 2020-06-30 NOTE — Discharge Instructions (Signed)
[facility] CALL OFFICE FOR F/U APPT [phone] in 6 days ALSO CALL FOR THERAPY APPT IN 6 DAYS FOR SPLINT  KEEP HAND ELEVATED ABOVE HEART OK TO APPLY ICE TO OPERATIVE AREA CONTACT OFFICE IF ANY WORSENING PAIN OR CONCERNS.

## 2020-06-30 NOTE — Anesthesia Preprocedure Evaluation (Signed)
Anesthesia Evaluation  Patient identified by MRN, date of birth, ID band Patient awake    Reviewed: Allergy & Precautions, NPO status , Patient's Chart, lab work & pertinent test results  Airway Mallampati: I  TM Distance: >3 FB Neck ROM: Full    Dental   Pulmonary Current Smoker,    Pulmonary exam normal        Cardiovascular Normal cardiovascular exam     Neuro/Psych    GI/Hepatic   Endo/Other    Renal/GU      Musculoskeletal   Abdominal   Peds  Hematology   Anesthesia Other Findings   Reproductive/Obstetrics                             Anesthesia Physical Anesthesia Plan  ASA: II  Anesthesia Plan: General   Post-op Pain Management:    Induction: Intravenous  PONV Risk Score and Plan: Ondansetron and Midazolam  Airway Management Planned: LMA  Additional Equipment:   Intra-op Plan:   Post-operative Plan: Extubation in OR  Informed Consent: I have reviewed the patients History and Physical, chart, labs and discussed the procedure including the risks, benefits and alternatives for the proposed anesthesia with the patient or authorized representative who has indicated his/her understanding and acceptance.       Plan Discussed with: CRNA and Surgeon  Anesthesia Plan Comments:         Anesthesia Quick Evaluation

## 2020-06-30 NOTE — Anesthesia Postprocedure Evaluation (Signed)
Anesthesia Post Note  Patient: [name]  Procedure(s) Performed: Revision of Right middle finger amputation (Right )     Patient location during evaluation: PACU Anesthesia Type: General Level of consciousness: awake and alert Pain management: pain level controlled Vital Signs Assessment: post-procedure vital signs reviewed and stable Respiratory status: spontaneous breathing, nonlabored ventilation, respiratory function stable and patient connected to nasal cannula oxygen Cardiovascular status: blood pressure returned to baseline and stable Postop Assessment: no apparent nausea or vomiting Anesthetic complications: no   No complications documented.  Last Vitals:  Vitals:   06/30/20 0940 06/30/20 0955  BP: (!) 156/86 (!) 144/90  Pulse: 72 76  Resp: 13 17  Temp:    SpO2: 92% 93%    Last Pain:  Vitals:   06/30/20 0955  TempSrc:   PainSc: 0-No pain                 Vaishali Baise DAVID

## 2020-06-30 NOTE — Transfer of Care (Signed)
Immediate Anesthesia Transfer of Care Note  Patient: EVALEIGH MCCAMY  Procedure(s) Performed: Revision of Right middle finger amputation (Right )  Patient Location: PACU  Anesthesia Type:General  Level of Consciousness: awake, alert , oriented and patient cooperative  Airway & Oxygen Therapy: Patient Spontanous Breathing  Post-op Assessment: Report given to RN and Post -op Vital signs reviewed and stable  Post vital signs: Reviewed and stable  Last Vitals:  Vitals Value Taken Time  BP 196/94 06/30/20 0925  Temp    Pulse 73 06/30/20 0925  Resp 12 06/30/20 0925  SpO2 100 % 06/30/20 0925  Vitals shown include unvalidated device data.  Last Pain:  Vitals:   06/30/20 0729  TempSrc: Oral  PainSc: 5       Patients Stated Pain Goal: 3 (50/72/25 7505)  Complications: No complications documented.

## 2020-07-01 ENCOUNTER — Encounter (HOSPITAL_COMMUNITY): Payer: Self-pay | Admitting: Orthopedic Surgery

## 2020-07-12 ENCOUNTER — Encounter: Payer: Self-pay | Admitting: Genetic Counselor

## 2020-08-27 ENCOUNTER — Telehealth: Payer: Self-pay

## 2020-08-27 ENCOUNTER — Other Ambulatory Visit: Payer: Self-pay

## 2020-08-27 ENCOUNTER — Ambulatory Visit (INDEPENDENT_AMBULATORY_CARE_PROVIDER_SITE_OTHER): Payer: BC Managed Care – PPO | Admitting: Nurse Practitioner

## 2020-08-27 ENCOUNTER — Encounter: Payer: Self-pay | Admitting: Nurse Practitioner

## 2020-08-27 VITALS — BP 170/99 | HR 83 | Temp 96.5°F | Resp 20 | Ht 68.0 in | Wt 114.0 lb

## 2020-08-27 DIAGNOSIS — E039 Hypothyroidism, unspecified: Secondary | ICD-10-CM

## 2020-08-27 DIAGNOSIS — I10 Essential (primary) hypertension: Secondary | ICD-10-CM

## 2020-08-27 DIAGNOSIS — Z1159 Encounter for screening for other viral diseases: Secondary | ICD-10-CM

## 2020-08-27 DIAGNOSIS — F339 Major depressive disorder, recurrent, unspecified: Secondary | ICD-10-CM | POA: Diagnosis not present

## 2020-08-27 DIAGNOSIS — Z23 Encounter for immunization: Secondary | ICD-10-CM

## 2020-08-27 DIAGNOSIS — Z1211 Encounter for screening for malignant neoplasm of colon: Secondary | ICD-10-CM

## 2020-08-27 DIAGNOSIS — C50011 Malignant neoplasm of nipple and areola, right female breast: Secondary | ICD-10-CM | POA: Diagnosis not present

## 2020-08-27 DIAGNOSIS — Z171 Estrogen receptor negative status [ER-]: Secondary | ICD-10-CM

## 2020-08-27 DIAGNOSIS — Z1212 Encounter for screening for malignant neoplasm of rectum: Secondary | ICD-10-CM

## 2020-08-27 MED ORDER — VENLAFAXINE HCL ER 75 MG PO CP24: 75.0000 mg | ORAL_CAPSULE | Freq: Every day | ORAL | 1 refills | Status: DC

## 2020-08-27 MED ORDER — LEVOTHYROXINE SODIUM 100 MCG PO TABS: 100.0000 ug | ORAL_TABLET | Freq: Every day | ORAL | 1 refills | Status: DC

## 2020-08-27 MED ORDER — LISINOPRIL 20 MG PO TABS: 20.0000 mg | ORAL_TABLET | Freq: Every day | ORAL | 1 refills | Status: DC

## 2020-08-27 MED ORDER — LISINOPRIL 10 MG PO TABS: ORAL_TABLET | ORAL | 1 refills | Status: DC

## 2020-08-27 NOTE — Telephone Encounter (Signed)
Please review and advise.

## 2020-08-27 NOTE — Patient Instructions (Signed)
Stress, Adult Stress is a normal reaction to life events. Stress is what you feel when life demands more than you are used to, or more than you think you can handle. Some stress can be useful, such as studying for a test or meeting a deadline at work. Stress that occurs too often or for too long can cause problems. It can affect your emotional health and interfere with relationships and normal daily activities. Too much stress can weaken your body's defense system (immune system) and increase your risk for physical illness. If you already have a medical problem, stress can make it worse. What are the causes? All sorts of life events can cause stress. An event that causes stress for one person may not be stressful for another person. Major life events, whether positive or negative, commonly cause stress. Examples include:  Losing a job or starting a new job.  Losing a loved one.  Moving to a new town or home.  Getting married or divorced.  Having a baby.  Getting injured or sick. Less obvious life events can also cause stress, especially if they occur day after day or in combination with each other. Examples include:  Working long hours.  Driving in traffic.  Caring for children.  Being in debt.  Being in a difficult relationship. What are the signs or symptoms? Stress can cause emotional symptoms, including:  Anxiety. This is feeling worried, afraid, on edge, overwhelmed, or out of control.  Anger, including irritation or impatience.  Depression. This is feeling sad, down, helpless, or guilty.  Trouble focusing, remembering, or making decisions. Stress can cause physical symptoms, including:  Aches and pains. These may affect your head, neck, back, stomach, or other areas of your body.  Tight muscles or a clenched jaw.  Low energy.  Trouble sleeping. Stress can cause unhealthy behaviors, including:  Eating to feel better (overeating) or skipping meals.  Working too  much or putting off tasks.  Smoking, drinking alcohol, or using drugs to feel better. How is this diagnosed? Stress is diagnosed through an assessment by your health care provider. He or she may diagnose this condition based on:  Your symptoms and any stressful life events.  Your medical history.  Tests to rule out other causes of your symptoms. Depending on your condition, your health care provider may refer you to a specialist for further evaluation. How is this treated?  Stress management techniques are the recommended treatment for stress. Medicine is not typically recommended for the treatment of stress. Techniques to reduce your reaction to stressful life events include:  Stress identification. Monitor yourself for symptoms of stress and identify what causes stress for you. These skills may help you to avoid or prepare for stressful events.  Time management. Set your priorities, keep a calendar of events, and learn to say no. Taking these actions can help you avoid making too many commitments. Techniques for coping with stress include:  Rethinking the problem. Try to think realistically about stressful events rather than ignoring them or overreacting. Try to find the positives in a stressful situation rather than focusing on the negatives.  Exercise. Physical exercise can release both physical and emotional tension. The key is to find a form of exercise that you enjoy and do it regularly.  Relaxation techniques. These relax the body and mind. The key is to find one or more that you enjoy and use the techniques regularly. Examples include: ? Meditation, deep breathing, or progressive relaxation techniques. ? Yoga or   tai chi. ? Biofeedback, mindfulness techniques, or journaling. ? Listening to music, being out in nature, or participating in other hobbies.  Practicing a healthy lifestyle. Eat a balanced diet, drink plenty of water, limit or avoid caffeine, and get plenty of  sleep.  Having a strong support network. Spend time with family, friends, or other people you enjoy being around. Express your feelings and talk things over with someone you trust. Counseling or talk therapy with a mental health professional may be helpful if you are having trouble managing stress on your own. Follow these instructions at home: Lifestyle   Avoid drugs.  Do not use any products that contain nicotine or tobacco, such as cigarettes, e-cigarettes, and chewing tobacco. If you need help quitting, ask your health care provider.  Limit alcohol intake to no more than 1 drink a day for nonpregnant women and 2 drinks a day for men. One drink equals 12 oz of beer, 5 oz of wine, or 1 oz of hard liquor  Do not use alcohol or drugs to relax.  Eat a balanced diet that includes fresh fruits and vegetables, whole grains, lean meats, fish, eggs, and beans, and low-fat dairy. Avoid processed foods and foods high in added fat, sugar, and salt.  Exercise at least 30 minutes on 5 or more days each week.  Get 7-8 hours of sleep each night. General instructions   Practice stress management techniques as discussed with your health care provider.  Drink enough fluid to keep your urine clear or pale yellow.  Take over-the-counter and prescription medicines only as told by your health care provider.  Keep all follow-up visits as told by your health care provider. This is important. Contact a health care provider if:  Your symptoms get worse.  You have new symptoms.  You feel overwhelmed by your problems and can no longer manage them on your own. Get help right away if:  You have thoughts of hurting yourself or others. If you ever feel like you may hurt yourself or others, or have thoughts about taking your own life, get help right away. You can go to your nearest emergency department or call:  Your local emergency services (911 in the [place]  A suicide crisis helpline, such as the  [place] at (316) 250-6172. This is open 24 hours a day. Summary  Stress is a normal reaction to life events. It can cause problems if it happens too often or for too long.  Practicing stress management techniques is the best way to treat stress.  Counseling or talk therapy with a mental health professional may be helpful if you are having trouble managing stress on your own. This information is not intended to replace advice given to you by your health care provider. Make sure you discuss any questions you have with your health care provider. Document Revised: 06/10/2019 Document Reviewed: 12/31/2016 Elsevier Patient Education  [place].

## 2020-08-27 NOTE — Telephone Encounter (Signed)
New rx sent to [facility] and patient notified

## 2020-08-27 NOTE — Progress Notes (Signed)
Subjective:    Patient ID: [name], female    DOB: 11-06-1961, 59 y.o.   MRN: [id number]   Chief Complaint: BP has been elevated and Abnormal thyroid    HPI:  1. Acquired hypothyroidism She has been taking her synthroid daily. [facility] practitioner at [name] changed her to euthyrox because her TSH was 21.6. she just started on new medication at end os September.  2. Depression, recurrent ([name]) THe NP at [name] also increased her effexor from 37.5 to 70m daily. She says she is doing fine. Depression screen [facility]2/9 01/03/2020 01/07/2018 03/25/2017  Decreased Interest 0 0 0  Down, Depressed, Hopeless 0 0 0  PHQ - 2 Score 0 0 0     3. Malignant neoplasm of nipple of right breast in female, estrogen receptor negative ([facility] Had in 07/2010 and has soince been released from aoncology. She had right mastectomy.    Outpatient Encounter Medications as of 08/27/2020  Medication Sig  . Calcium Carbonate-Vitamin D (CALCIUM + D PO) Take by mouth.    . Cyanocobalamin (B-12 PO) Take by mouth.  . Flaxseed, Linseed, (FLAXSEED OIL PO) Take by mouth.  . levothyroxine (SYNTHROID, LEVOTHROID) 100 MCG tablet Take 100 mcg by mouth daily.  .Marland Kitchenloratadine (CLARITIN) 10 MG tablet Take 10 mg by mouth daily.  . Multiple Vitamin (MULTIVITAMIN) capsule Take 1 capsule by mouth daily.    .Marland Kitchenvenlafaxine XR (EFFEXOR-XR) 75 MG 24 hr capsule Take 75 mg by mouth daily.     Past Surgical History:  Procedure Laterality Date  . AMPUTATION Right 06/30/2020   Procedure: Revision of Right middle finger amputation;  Surgeon: OIran Planas MD;  [facility]  Service: Orthopedics;  Laterality: Right;  . BREAST SURGERY  04/10/2011   Right mastectomy and slnbx  . LYMPH NODE BIOPSY  1996   rt groin  . MULTIPLE TOOTH EXTRACTIONS    . PORT-A-CATH REMOVAL  2012   insertion and out  . RADIOACTIVE SEED GUIDED EXCISIONAL BREAST BIOPSY Left 01/16/2015   Procedure: RADIOACTIVE SEED GUIDED EXCISIONAL BREAST BIOPSY;  Surgeon:  DAlphonsa Overall MD;  [facility]  Service: General;  Laterality: Left;  . TUBAL LIGATION      Family History  Problem Relation Age of Onset  . Stroke Mother   . Hypertension Mother   . Heart disease Mother   . Cancer Maternal Grandmother        Breast cancer    New complaints: She has had high readings on her blood pressure. It has been running around 1947-654systolic. This morning it was 170/99. She is not on any medication. She cut the tip of her finger off at work on August 6,2021 Having toruble sleeping. Trouble is staying asleep.  Social history: Lives with fiance. Works 8 hour shifts at [employer]  Controlled substance contract: n/a    Review of Systems  Constitutional: Negative for diaphoresis.  Eyes: Negative for pain.  Respiratory: Negative for shortness of breath.   Cardiovascular: Negative for chest pain, palpitations and leg swelling.  Gastrointestinal: Negative for abdominal pain.  Endocrine: Negative for polydipsia.  Skin: Negative for rash.  Neurological: Negative for dizziness, weakness and headaches.  Hematological: Does not bruise/bleed easily.  All other systems reviewed and are negative.      Objective:   Physical Exam Vitals and nursing note reviewed.  Constitutional:      General: She is not in acute distress.    Appearance: Normal appearance. She is well-developed.  HENT:     Head: Normocephalic.     Nose: Nose normal.  Eyes:     Pupils: Pupils are equal, round, and reactive to light.  Neck:     Vascular: No carotid bruit or JVD.  Cardiovascular:     Rate and Rhythm: Normal rate and regular rhythm.     Heart sounds: Normal heart sounds.  Pulmonary:     Effort: Pulmonary effort is normal. No respiratory distress.     Breath sounds: Normal breath sounds. No wheezing or rales.  Chest:     Chest wall: No tenderness.  Abdominal:     General: Bowel sounds are normal. There is no distension or abdominal bruit.      Palpations: Abdomen is soft. There is no hepatomegaly, splenomegaly, mass or pulsatile mass.     Tenderness: There is no abdominal tenderness.  Musculoskeletal:        General: Normal range of motion.     Cervical back: Normal range of motion and neck supple.     Comments: Right middle finger is in brace with distal tip missing at distal PIP joint.  Lymphadenopathy:     Cervical: No cervical adenopathy.  Skin:    General: Skin is warm and dry.  Neurological:     Mental Status: She is alert and oriented to person, place, and time.     Deep Tendon Reflexes: Reflexes are normal and symmetric.  Psychiatric:        Behavior: Behavior normal.        Thought Content: Thought content normal.        Judgment: Judgment normal.    BP (!) 170/99   Pulse 83   Temp (!) 96.5 F (35.8 C) (Temporal)   Resp 20   Ht _0  (1.727 m)   Wt 114 lb (51.7 kg)   SpO2 97%   BMI 17.33 kg/m        Assessment & Plan:  KIANI WURTZEL comes in today with chief complaint of BP has been elevated and Abnormal thyroid   Diagnosis and orders addressed:  1. Acquired hypothyroidism Repeat labs - levothyroxine (EUTHYROX) 100 MCG tablet; Take 1 tablet (100 mcg total) by mouth daily before breakfast.  Dispense: 90 tablet; Refill: 1 - Thyroid Panel With TSH - CBC with Differential/Platelet  2. Depression, recurrent ([facility]) Stress management - venlafaxine XR (EFFEXOR-XR) 75 MG 24 hr capsule; Take 1 capsule (75 mg total) by mouth daily.  Dispense: 90 capsule; Refill: 1  3. Malignant neoplasm of nipple of right breast in female, estrogen receptor negative ([place]) released  4. Primary hypertension Keep diary of blood pressure Lisinopril 37m 1 po daily - CMP14+EGFR - Lipid panel  5 insomnia Try ,melatonin OTC- if does not work we will try something a week.  Labs pending Health Maintenance reviewed- will do papa at next visit- pat will schedule mammogram Diet and exercise encouraged  Follow up plan: 3  months   Mary-Margaret MHassell Done FNP

## 2020-08-28 ENCOUNTER — Other Ambulatory Visit: Payer: Self-pay

## 2020-08-28 DIAGNOSIS — E039 Hypothyroidism, unspecified: Secondary | ICD-10-CM

## 2020-08-28 LAB — CMP14+EGFR
ALT: 33 IU/L — ABNORMAL HIGH (ref 0–32)
AST: 36 IU/L (ref 0–40)
Albumin/Globulin Ratio: 2 (ref 1.2–2.2)
Albumin: 4.6 g/dL (ref 3.8–4.9)
Alkaline Phosphatase: 127 IU/L — ABNORMAL HIGH (ref 44–121)
BUN/Creatinine Ratio: 10 (ref 9–23)
BUN: 8 mg/dL (ref 6–24)
Bilirubin Total: <0.2 mg/dL (ref 0.0–1.2)
CO2: 24 mmol/L (ref 20–29)
Calcium: 9.3 mg/dL (ref 8.7–10.2)
Chloride: 102 mmol/L (ref 96–106)
Creatinine, Ser: 0.83 mg/dL (ref 0.57–1.00)
GFR calc Af Amer: 90 mL/min/{1.73_m2} (ref >59)
GFR calc non Af Amer: 78 mL/min/{1.73_m2} (ref >59)
Globulin, Total: 2.3 g/dL (ref 1.5–4.5)
Glucose: 98 mg/dL (ref 65–99)
Potassium: 4.1 mmol/L (ref 3.5–5.2)
Sodium: 141 mmol/L (ref 134–144)
Total Protein: 6.9 g/dL (ref 6.0–8.5)

## 2020-08-28 LAB — CBC WITH DIFFERENTIAL/PLATELET
Basophils Absolute: 0 10*3/uL (ref 0.0–0.2)
Basos: 0 %
EOS (ABSOLUTE): 0.1 10*3/uL (ref 0.0–0.4)
Eos: 1 %
Hematocrit: 41.4 % (ref 34.0–46.6)
Hemoglobin: 13.8 g/dL (ref 11.1–15.9)
Immature Grans (Abs): 0 10*3/uL (ref 0.0–0.1)
Immature Granulocytes: 0 %
Lymphocytes Absolute: 2.3 10*3/uL (ref 0.7–3.1)
Lymphs: 34 %
MCH: 31 pg (ref 26.6–33.0)
MCHC: 33.3 g/dL (ref 31.5–35.7)
MCV: 93 fL (ref 79–97)
Monocytes Absolute: 0.6 10*3/uL (ref 0.1–0.9)
Monocytes: 9 %
Neutrophils Absolute: 3.7 10*3/uL (ref 1.4–7.0)
Neutrophils: 56 %
Platelets: 311 10*3/uL (ref 150–450)
RBC: 4.45 x10E6/uL (ref 3.77–5.28)
RDW: 12.3 % (ref 11.7–15.4)
WBC: 6.7 10*3/uL (ref 3.4–10.8)

## 2020-08-28 LAB — LIPID PANEL
Chol/HDL Ratio: 3.7 ratio (ref 0.0–4.4)
Cholesterol, Total: 225 mg/dL — ABNORMAL HIGH (ref 100–199)
HDL: 61 mg/dL (ref >39)
LDL Chol Calc (NIH): 138 mg/dL — ABNORMAL HIGH (ref 0–99)
Triglycerides: 148 mg/dL (ref 0–149)
VLDL Cholesterol Cal: 26 mg/dL (ref 5–40)

## 2020-08-28 LAB — HEPATITIS C ANTIBODY: Hep C Virus Ab: <0.1 s/co ratio (ref 0.0–0.9)

## 2020-08-28 LAB — THYROID PANEL WITH TSH
Free Thyroxine Index: 2.1 (ref 1.2–4.9)
T3 Uptake Ratio: 24 % (ref 24–39)
T4, Total: 8.8 ug/dL (ref 4.5–12.0)
TSH: 4.85 u[IU]/mL — ABNORMAL HIGH (ref 0.450–4.500)

## 2020-10-22 ENCOUNTER — Other Ambulatory Visit: Payer: BC Managed Care – PPO

## 2020-10-22 ENCOUNTER — Other Ambulatory Visit: Payer: Self-pay

## 2020-10-22 DIAGNOSIS — E039 Hypothyroidism, unspecified: Secondary | ICD-10-CM | POA: Diagnosis not present

## 2020-10-23 LAB — THYROID PANEL WITH TSH
Free Thyroxine Index: 2.1 (ref 1.2–4.9)
T3 Uptake Ratio: 24 % (ref 24–39)
T4, Total: 8.9 ug/dL (ref 4.5–12.0)
TSH: 6.03 u[IU]/mL — ABNORMAL HIGH (ref 0.450–4.500)

## 2020-10-25 ENCOUNTER — Telehealth: Payer: Self-pay

## 2020-10-25 MED ORDER — LEVOTHYROXINE SODIUM 112 MCG PO TABS: 112.0000 ug | ORAL_TABLET | Freq: Every day | ORAL | 0 refills | Status: DC

## 2020-10-25 NOTE — Telephone Encounter (Signed)
Per labwork 112 mcg sent to [place], pt aware

## 2020-11-29 ENCOUNTER — Ambulatory Visit (INDEPENDENT_AMBULATORY_CARE_PROVIDER_SITE_OTHER): Payer: BC Managed Care – PPO | Admitting: Nurse Practitioner

## 2020-11-29 ENCOUNTER — Other Ambulatory Visit: Payer: Self-pay

## 2020-11-29 ENCOUNTER — Ambulatory Visit (INDEPENDENT_AMBULATORY_CARE_PROVIDER_SITE_OTHER): Payer: BC Managed Care – PPO

## 2020-11-29 ENCOUNTER — Encounter: Payer: Self-pay | Admitting: Nurse Practitioner

## 2020-11-29 ENCOUNTER — Other Ambulatory Visit (HOSPITAL_COMMUNITY)
Admission: RE | Admit: 2020-11-29 | Discharge: 2020-11-29 | Disposition: A | Discharge status: Home / Self Care | Payer: Self-pay | Source: Ambulatory Visit | Attending: Nurse Practitioner | Admitting: Nurse Practitioner

## 2020-11-29 VITALS — BP 184/99 | HR 98 | Temp 96.8°F | Resp 20 | Ht 68.0 in | Wt 116.0 lb

## 2020-11-29 DIAGNOSIS — F17200 Nicotine dependence, unspecified, uncomplicated: Secondary | ICD-10-CM

## 2020-11-29 DIAGNOSIS — E039 Hypothyroidism, unspecified: Secondary | ICD-10-CM

## 2020-11-29 DIAGNOSIS — F339 Major depressive disorder, recurrent, unspecified: Secondary | ICD-10-CM

## 2020-11-29 DIAGNOSIS — J449 Chronic obstructive pulmonary disease, unspecified: Secondary | ICD-10-CM | POA: Diagnosis not present

## 2020-11-29 DIAGNOSIS — Z0000 Encounter for general adult medical examination without abnormal findings: Secondary | ICD-10-CM | POA: Diagnosis not present

## 2020-11-29 DIAGNOSIS — E7800 Pure hypercholesterolemia, unspecified: Secondary | ICD-10-CM

## 2020-11-29 DIAGNOSIS — Z0001 Encounter for general adult medical examination with abnormal findings: Secondary | ICD-10-CM

## 2020-11-29 DIAGNOSIS — I10 Essential (primary) hypertension: Secondary | ICD-10-CM

## 2020-11-29 LAB — URINALYSIS, COMPLETE
Bilirubin, UA: NEGATIVE
Glucose, UA: NEGATIVE
Ketones, UA: NEGATIVE
Leukocytes,UA: NEGATIVE
Nitrite, UA: NEGATIVE
Protein,UA: NEGATIVE
Specific Gravity, UA: 1.02 (ref 1.005–1.030)
Urobilinogen, Ur: 0.2 mg/dL (ref 0.2–1.0)
pH, UA: 7 (ref 5.0–7.5)

## 2020-11-29 LAB — MICROSCOPIC EXAMINATION
Bacteria, UA: NONE SEEN
WBC, UA: NONE SEEN /hpf (ref 0–5)

## 2020-11-29 MED ORDER — LISINOPRIL 40 MG PO TABS: 40.0000 mg | ORAL_TABLET | Freq: Every day | ORAL | 1 refills | Status: DC

## 2020-11-29 MED ORDER — AMLODIPINE BESYLATE 5 MG PO TABS: 5.0000 mg | ORAL_TABLET | Freq: Every day | ORAL | 1 refills | Status: DC

## 2020-11-29 NOTE — Progress Notes (Signed)
Subjective:    Patient ID: [name], female    DOB: [birth year], 60 y.o.   MRN: [id number]   Chief Complaint: annual physical   HPI:  1. Annual physical exam She is also having a pap today  2. Acquired hypothyroidism She is having no problems that she is aware of. We increase her euthyrox to 167mg todayt and we will recheck labs today. Lab Results  Component Value Date   TSH 6.030 (H) 10/22/2020     3. [place] She is currently on effexor daily and is doing well.  4 primary hyertension We started her on lisinopril at last visit. She does not check her blood pressure at home. 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 [facility] 580[facility]2 tablet daily. BP Readings from Last 3 Encounters:  08/27/20 (!) 170/99  06/30/20 (!) 144/90  06/29/20 (!) 188/105      Outpatient Encounter Medications as of 11/29/2020  Medication Sig  . Calcium Carbonate-Vitamin D (CALCIUM + D PO) Take by mouth.    . Cyanocobalamin (B-12 PO) Take by mouth.  . Flaxseed, Linseed, (FLAXSEED OIL PO) Take by mouth.  . levothyroxine (SYNTHROID) 112 MCG tablet Take 1 tablet (112 mcg total) by mouth daily.  . Marland Kitchenisinopril (ZESTRIL) 10 MG tablet Take 2 tablets daily.  . Marland Kitchenisinopril (ZESTRIL) 20 MG tablet Take 1 tablet (20 mg total) by mouth daily.  . Marland Kitchenoratadine (CLARITIN) 10 MG tablet Take 10 mg by mouth daily.  . Multiple Vitamin (MULTIVITAMIN) capsule Take 1 capsule by mouth daily.    . Marland Kitchenenlafaxine XR (EFFEXOR-XR) 75 MG 24 hr capsule Take 1 capsule (75 mg total) by mouth daily.   No facility-administered encounter medications on file as of 11/29/2020.    Past Surgical History:  Procedure Laterality Date  . AMPUTATION Right 06/30/2020   Procedure: Revision of Right middle finger amputation;  Surgeon: OrIran PlanasMD;  [facility];  Laterality: Right;  . BREAST SURGERY  04/10/2011   Right mastectomy and slnbx   . LYMPH NODE BIOPSY  1996   rt groin  . MULTIPLE TOOTH EXTRACTIONS    . PORT-A-CATH REMOVAL  2012   insertion and out  . RADIOACTIVE SEED GUIDED EXCISIONAL BREAST BIOPSY Left 01/16/2015   Procedure: RADIOACTIVE SEED GUIDED EXCISIONAL BREAST BIOPSY;  Surgeon: DaAlphonsa OverallMD;  [facility] Service: General;  Laterality: Left;  . TUBAL LIGATION      Family History  Problem Relation Age of Onset  . Stroke Mother   . Hypertension Mother   . Heart disease Mother   . Cancer Maternal Grandmother        Breast cancer    New complaints: Left foot swelling. She just noticed a week or so ago that top of left foot is swollen.  Social history: Lives with her fiance. Still working at un[employer]rolled substance contract: n/a    Review of Systems  Constitutional: Negative for diaphoresis.  Eyes: Negative for pain.  Respiratory: Negative for shortness of breath.   Cardiovascular: Negative for chest pain, palpitations and leg swelling.  Gastrointestinal: Negative for abdominal pain.  Endocrine: Negative for polydipsia.  Skin: Negative for rash.  Neurological: Negative for dizziness, weakness and headaches.  Hematological: Does not bruise/bleed easily.  All other systems reviewed and are negative.      Objective:   Physical Exam Vitals and nursing note reviewed.  Constitutional:  General: She is not in acute distress.    Appearance: Normal appearance. She is well-developed and well-nourished.  HENT:     Head: Normocephalic.     Nose: Nose normal.     Mouth/Throat:     Mouth: Oropharynx is clear and moist.  Eyes:     Extraocular Movements: EOM normal.     Pupils: Pupils are equal, round, and reactive to light.  Neck:     Vascular: No carotid bruit or JVD.  Cardiovascular:     Rate and Rhythm: Normal rate and regular rhythm.     Pulses: Intact distal pulses.     Heart sounds: Normal heart sounds.  Pulmonary:     Effort: Pulmonary effort is  normal. No respiratory distress.     Breath sounds: Normal breath sounds. No wheezing or rales.  Chest:     Chest wall: No tenderness.  Abdominal:     General: Bowel sounds are normal. There is no distension or abdominal bruit. Aorta is normal.     Palpations: Abdomen is soft. There is no hepatomegaly, splenomegaly, mass or pulsatile mass.     Tenderness: There is no abdominal tenderness.  Genitourinary:    General: Normal vulva.     Vagina: No vaginal discharge.     Rectum: Normal.     Comments: Cervix parous and pink No adnexal masses or tenderness Musculoskeletal:        General: No edema. Normal range of motion.     Cervical back: Normal range of motion and neck supple.  Lymphadenopathy:     Cervical: No cervical adenopathy.  Skin:    General: Skin is warm and dry.  Neurological:     Mental Status: She is alert and oriented to person, place, and time.     Deep Tendon Reflexes: Reflexes are normal and symmetric.  Psychiatric:        Mood and Affect: Mood and affect normal.        Behavior: Behavior normal.        Thought Content: Thought content normal.        Judgment: Judgment normal.     Chest xray- radiology pending EKG-NSR-Mary-Margaret Hassell Done, FNP   BP (!) 184/99   Pulse 98   Temp (!) 96.8 F (36 C) (Temporal)   Resp 20   Ht _0  (1.727 m)   Wt 116 lb (52.6 kg)   SpO2 97%   BMI 17.64 kg/m      Assessment & Plan:  ZION LINT comes in today with chief complaint of Annual Exam   Diagnosis and orders addressed:  1. Annual physical exam - Cytology - PAP  2. Acquired hypothyroidism Labs pending - Thyroid Panel With TSH  3. Depression, recurrent ([place]) stress management  4. Primary hypertension Increased  lisinopril - CBC with Differential/Platelet - CMP14+EGFR - Lipid panel - lisinopril (ZESTRIL) 40 MG tablet; Take 1 tablet (40 mg total) by mouth daily.  Dispense: 90 tablet; Refill: 1 - amLODipine (NORVASC) 5 MG tablet; Take 1 tablet (5 mg  total) by mouth daily.  Dispense: 90 tablet; Refill: 1   Labs pending Health Maintenance reviewed Diet and exercise encouraged  Follow up plan: 6 months   Mary-Margaret Hassell Done, FNP

## 2020-11-29 NOTE — Patient Instructions (Signed)
DASH Eating Plan DASH stands for "Dietary Approaches to Stop Hypertension." The DASH eating plan is a healthy eating plan that has been shown to reduce high blood pressure (hypertension). It may also reduce your risk for type 2 diabetes, heart disease, and stroke. The DASH eating plan may also help with weight loss. What are tips for following this plan?  General guidelines  Avoid eating more than 2,300 mg (milligrams) of salt (sodium) a day. If you have hypertension, you may need to reduce your sodium intake to 1,500 mg a day.  Limit alcohol intake to no more than 1 drink a day for nonpregnant women and 2 drinks a day for men. One drink equals 12 oz of beer, 5 oz of wine, or 1 oz of hard liquor.  Work with your health care provider to maintain a healthy body weight or to lose weight. Ask what an ideal weight is for you.  Get at least 30 minutes of exercise that causes your heart to beat faster (aerobic exercise) most days of the week. Activities may include walking, swimming, or biking.  Work with your health care provider or diet and nutrition specialist (dietitian) to adjust your eating plan to your individual calorie needs. Reading food labels   Check food labels for the amount of sodium per serving. Choose foods with less than 5 percent of the Daily Value of sodium. Generally, foods with less than 300 mg of sodium per serving fit into this eating plan.  To find whole grains, look for the word "whole" as the first word in the ingredient list. Shopping  Buy products labeled as "low-sodium" or "no salt added."  Buy fresh foods. Avoid canned foods and premade or frozen meals. Cooking  Avoid adding salt when cooking. Use salt-free seasonings or herbs instead of table salt or sea salt. Check with your health care provider or pharmacist before using salt substitutes.  Do not fry foods. Cook foods using healthy methods such as baking, boiling, grilling, and broiling instead.  Cook with  heart-healthy oils, such as olive, canola, soybean, or sunflower oil. Meal planning  Eat a balanced diet that includes: ? 5 or more servings of fruits and vegetables each day. At each meal, try to fill half of your plate with fruits and vegetables. ? Up to 6-8 servings of whole grains each day. ? Less than 6 oz of lean meat, poultry, or fish each day. A 3-oz serving of meat is about the same size as a deck of cards. One egg equals 1 oz. ? 2 servings of low-fat dairy each day. ? A serving of nuts, seeds, or beans 5 times each week. ? Heart-healthy fats. Healthy fats called Omega-3 fatty acids are found in foods such as flaxseeds and coldwater fish, like sardines, salmon, and mackerel.  Limit how much you eat of the following: ? Canned or prepackaged foods. ? Food that is high in trans fat, such as fried foods. ? Food that is high in saturated fat, such as fatty meat. ? Sweets, desserts, sugary drinks, and other foods with added sugar. ? Full-fat dairy products.  Do not salt foods before eating.  Try to eat at least 2 vegetarian meals each week.  Eat more home-cooked food and less restaurant, buffet, and fast food.  When eating at a restaurant, ask that your food be prepared with less salt or no salt, if possible. What foods are recommended? The items listed may not be a complete list. Talk with your dietitian about   what dietary choices are best for you. Grains Whole-grain or whole-wheat bread. Whole-grain or whole-wheat pasta. Brown rice. Oatmeal. Quinoa. Bulgur. Whole-grain and low-sodium cereals. Pita bread. Low-fat, low-sodium crackers. Whole-wheat flour tortillas. Vegetables Fresh or frozen vegetables (raw, steamed, roasted, or grilled). Low-sodium or reduced-sodium tomato and vegetable juice. Low-sodium or reduced-sodium tomato sauce and tomato paste. Low-sodium or reduced-sodium canned vegetables. Fruits All fresh, dried, or frozen fruit. Canned fruit in natural juice (without  added sugar). Meat and other protein foods Skinless chicken or turkey. Ground chicken or turkey. Pork with fat trimmed off. Fish and seafood. Egg whites. Dried beans, peas, or lentils. Unsalted nuts, nut butters, and seeds. Unsalted canned beans. Lean cuts of beef with fat trimmed off. Low-sodium, lean deli meat. Dairy Low-fat (1%) or fat-free (skim) milk. Fat-free, low-fat, or reduced-fat cheeses. Nonfat, low-sodium ricotta or cottage cheese. Low-fat or nonfat yogurt. Low-fat, low-sodium cheese. Fats and oils Soft margarine without trans fats. Vegetable oil. Low-fat, reduced-fat, or light mayonnaise and salad dressings (reduced-sodium). Canola, safflower, olive, soybean, and sunflower oils. Avocado. Seasoning and other foods Herbs. Spices. Seasoning mixes without salt. Unsalted popcorn and pretzels. Fat-free sweets. What foods are not recommended? The items listed may not be a complete list. Talk with your dietitian about what dietary choices are best for you. Grains Baked goods made with fat, such as croissants, muffins, or some breads. Dry pasta or rice meal packs. Vegetables Creamed or fried vegetables. Vegetables in a cheese sauce. Regular canned vegetables (not low-sodium or reduced-sodium). Regular canned tomato sauce and paste (not low-sodium or reduced-sodium). Regular tomato and vegetable juice (not low-sodium or reduced-sodium). Pickles. Olives. Fruits Canned fruit in a light or heavy syrup. Fried fruit. Fruit in cream or butter sauce. Meat and other protein foods Fatty cuts of meat. Ribs. Fried meat. Bacon. Sausage. Bologna and other processed lunch meats. Salami. Fatback. Hotdogs. Bratwurst. Salted nuts and seeds. Canned beans with added salt. Canned or smoked fish. Whole eggs or egg yolks. Chicken or turkey with skin. Dairy Whole or 2% milk, cream, and half-and-half. Whole or full-fat cream cheese. Whole-fat or sweetened yogurt. Full-fat cheese. Nondairy creamers. Whipped toppings.  Processed cheese and cheese spreads. Fats and oils Butter. Stick margarine. Lard. Shortening. Ghee. Bacon fat. Tropical oils, such as coconut, palm kernel, or palm oil. Seasoning and other foods Salted popcorn and pretzels. Onion salt, garlic salt, seasoned salt, table salt, and sea salt. Worcestershire sauce. Tartar sauce. Barbecue sauce. Teriyaki sauce. Soy sauce, including reduced-sodium. Steak sauce. Canned and packaged gravies. Fish sauce. Oyster sauce. Cocktail sauce. Horseradish that you find on the shelf. Ketchup. Mustard. Meat flavorings and tenderizers. Bouillon cubes. Hot sauce and Tabasco sauce. Premade or packaged marinades. Premade or packaged taco seasonings. Relishes. Regular salad dressings. Where to find more information:  National Heart, Lung, and Blood Institute: www.nhlbi.nih.gov  American Heart Association: www.heart.org Summary  The DASH eating plan is a healthy eating plan that has been shown to reduce high blood pressure (hypertension). It may also reduce your risk for type 2 diabetes, heart disease, and stroke.  With the DASH eating plan, you should limit salt (sodium) intake to 2,300 mg a day. If you have hypertension, you may need to reduce your sodium intake to 1,500 mg a day.  When on the DASH eating plan, aim to eat more fresh fruits and vegetables, whole grains, lean proteins, low-fat dairy, and heart-healthy fats.  Work with your health care provider or diet and nutrition specialist (dietitian) to adjust your eating plan to your   individual calorie needs. This information is not intended to replace advice given to you by your health care provider. Make sure you discuss any questions you have with your health care provider. Document Revised: 10/23/2017 Document Reviewed: 11/03/2016 Elsevier Patient Education  2020 Elsevier Inc.  

## 2020-11-30 LAB — THYROID PANEL WITH TSH
Free Thyroxine Index: 2.2 (ref 1.2–4.9)
T3 Uptake Ratio: 26 % (ref 24–39)
T4, Total: 8.3 ug/dL (ref 4.5–12.0)
TSH: 7.45 u[IU]/mL — ABNORMAL HIGH (ref 0.450–4.500)

## 2020-11-30 LAB — CMP14+EGFR
ALT: 21 IU/L (ref 0–32)
AST: 23 IU/L (ref 0–40)
Albumin/Globulin Ratio: 1.6 (ref 1.2–2.2)
Albumin: 4.5 g/dL (ref 3.8–4.9)
Alkaline Phosphatase: 135 IU/L — ABNORMAL HIGH (ref 44–121)
BUN/Creatinine Ratio: 8 — ABNORMAL LOW (ref 9–23)
BUN: 7 mg/dL (ref 6–24)
Bilirubin Total: <0.2 mg/dL (ref 0.0–1.2)
CO2: 26 mmol/L (ref 20–29)
Calcium: 9.6 mg/dL (ref 8.7–10.2)
Chloride: 100 mmol/L (ref 96–106)
Creatinine, Ser: 0.86 mg/dL (ref 0.57–1.00)
GFR calc Af Amer: 86 mL/min/{1.73_m2} (ref >59)
GFR calc non Af Amer: 75 mL/min/{1.73_m2} (ref >59)
Globulin, Total: 2.8 g/dL (ref 1.5–4.5)
Glucose: 95 mg/dL (ref 65–99)
Potassium: 3.9 mmol/L (ref 3.5–5.2)
Sodium: 140 mmol/L (ref 134–144)
Total Protein: 7.3 g/dL (ref 6.0–8.5)

## 2020-11-30 LAB — CBC WITH DIFFERENTIAL/PLATELET
Basophils Absolute: 0.1 10*3/uL (ref 0.0–0.2)
Basos: 1 %
EOS (ABSOLUTE): 0.1 10*3/uL (ref 0.0–0.4)
Eos: 1 %
Hematocrit: 40.3 % (ref 34.0–46.6)
Hemoglobin: 14 g/dL (ref 11.1–15.9)
Immature Grans (Abs): 0 10*3/uL (ref 0.0–0.1)
Immature Granulocytes: 0 %
Lymphocytes Absolute: 3.1 10*3/uL (ref 0.7–3.1)
Lymphs: 41 %
MCH: 31.1 pg (ref 26.6–33.0)
MCHC: 34.7 g/dL (ref 31.5–35.7)
MCV: 90 fL (ref 79–97)
Monocytes Absolute: 0.7 10*3/uL (ref 0.1–0.9)
Monocytes: 9 %
Neutrophils Absolute: 3.6 10*3/uL (ref 1.4–7.0)
Neutrophils: 48 %
Platelets: 335 10*3/uL (ref 150–450)
RBC: 4.5 x10E6/uL (ref 3.77–5.28)
RDW: 12.1 % (ref 11.7–15.4)
WBC: 7.5 10*3/uL (ref 3.4–10.8)

## 2020-11-30 LAB — LIPID PANEL
Chol/HDL Ratio: 3.4 ratio (ref 0.0–4.4)
Cholesterol, Total: 225 mg/dL — ABNORMAL HIGH (ref 100–199)
HDL: 66 mg/dL (ref >39)
LDL Chol Calc (NIH): 135 mg/dL — ABNORMAL HIGH (ref 0–99)
Triglycerides: 135 mg/dL (ref 0–149)
VLDL Cholesterol Cal: 24 mg/dL (ref 5–40)

## 2020-11-30 MED ORDER — LEVOTHYROXINE SODIUM 125 MCG PO TABS
125.0000 ug | ORAL_TABLET | Freq: Every day | ORAL | 1 refills | Status: DC
Start: 2020-11-30 — End: 2021-05-30

## 2020-11-30 MED ORDER — ATORVASTATIN CALCIUM 40 MG PO TABS: 40.0000 mg | ORAL_TABLET | Freq: Every day | ORAL | 1 refills | Status: DC

## 2020-11-30 NOTE — Addendum Note (Signed)
Addended by: Chevis Pretty on: 11/30/2020 01:39 PM   Modules accepted: Orders

## 2020-12-03 LAB — CYTOLOGY - PAP: Diagnosis: NEGATIVE

## 2021-02-12 ENCOUNTER — Encounter: Payer: Self-pay | Admitting: Nurse Practitioner

## 2021-02-12 ENCOUNTER — Other Ambulatory Visit: Payer: Self-pay

## 2021-02-12 ENCOUNTER — Telehealth: Payer: Self-pay

## 2021-02-12 ENCOUNTER — Ambulatory Visit: Payer: BC Managed Care – PPO | Admitting: Nurse Practitioner

## 2021-02-12 VITALS — BP 152/85 | HR 86 | Temp 96.7°F | Resp 20 | Ht 68.0 in | Wt 115.0 lb

## 2021-02-12 DIAGNOSIS — F419 Anxiety disorder, unspecified: Secondary | ICD-10-CM

## 2021-02-12 DIAGNOSIS — E039 Hypothyroidism, unspecified: Secondary | ICD-10-CM

## 2021-02-12 MED ORDER — BUSPIRONE HCL 15 MG PO TABS
15.0000 mg | ORAL_TABLET | Freq: Three times a day (TID) | ORAL | 1 refills | Status: DC
Start: 2021-02-12 — End: 2021-05-30

## 2021-02-12 NOTE — Telephone Encounter (Signed)
Patient states that her fiance was diagnosed with prostate cancer yesterday and she is very upset. Wants to be seen for nerves. Appt given for today

## 2021-02-12 NOTE — Telephone Encounter (Signed)
Returned patients call and spoke with her husband. She was on another call and advised that she would call us back

## 2021-02-12 NOTE — Patient Instructions (Signed)
Textbook of family medicine (9th ed., pp. 1062-1073). [place], [place]: [employer].">  Stress, Adult Stress is a normal reaction to life events. Stress is what you feel when life demands more than you are used to, or more than you think you can handle. Some stress can be useful, such as studying for a test or meeting a deadline at work. Stress that occurs too often or for too long can cause problems. It can affect your emotional health and interfere with relationships and normal daily activities. Too much stress can weaken your body's defense system (immune system) and increase your risk for physical illness. If you already have a medical problem, stress can make it worse. What are the causes? All sorts of life events can cause stress. An event that causes stress for one person may not be stressful for another person. Major life events, whether positive or negative, commonly cause stress. Examples include:  Losing a job or starting a new job.  Losing a loved one.  Moving to a new town or home.  Getting married or divorced.  Having a baby.  Getting injured or sick. Less obvious life events can also cause stress, especially if they occur day after day or in combination with each other. Examples include:  Working long hours.  Driving in traffic.  Caring for children.  Being in debt.  Being in a difficult relationship. What are the signs or symptoms? Stress can cause emotional symptoms, including:  Anxiety. This is feeling worried, afraid, on edge, overwhelmed, or out of control.  Anger, including irritation or impatience.  Depression. This is feeling sad, down, helpless, or guilty.  Trouble focusing, remembering, or making decisions. Stress can cause physical symptoms, including:  Aches and pains. These may affect your head, neck, back, stomach, or other areas of your body.  Tight muscles or a clenched jaw.  Low energy.  Trouble sleeping. Stress can cause unhealthy  behaviors, including:  Eating to feel better (overeating) or skipping meals.  Working too much or putting off tasks.  Smoking, drinking alcohol, or using drugs to feel better. How is this diagnosed? Stress is diagnosed through an assessment by your health care provider. He or she may diagnose this condition based on:  Your symptoms and any stressful life events.  Your medical history.  Tests to rule out other causes of your symptoms. Depending on your condition, your health care provider may refer you to a specialist for further evaluation. How is this treated? Stress management techniques are the recommended treatment for stress. Medicine is not typically recommended for the treatment of stress. Techniques to reduce your reaction to stressful life events include:  Stress identification. Monitor yourself for symptoms of stress and identify what causes stress for you. These skills may help you to avoid or prepare for stressful events.  Time management. Set your priorities, keep a calendar of events, and learn to say no. Taking these actions can help you avoid making too many commitments. Techniques for coping with stress include:  Rethinking the problem. Try to think realistically about stressful events rather than ignoring them or overreacting. Try to find the positives in a stressful situation rather than focusing on the negatives.  Exercise. Physical exercise can release both physical and emotional tension. The key is to find a form of exercise that you enjoy and do it regularly.  Relaxation techniques. These relax the body and mind. The key is to find one or more that you enjoy and use the techniques regularly. Examples include: ?   Meditation, deep breathing, or progressive relaxation techniques. ? Yoga or tai chi. ? Biofeedback, mindfulness techniques, or journaling. ? Listening to music, being out in nature, or participating in other hobbies.  Practicing a healthy lifestyle.  Eat a balanced diet, drink plenty of water, limit or avoid caffeine, and get plenty of sleep.  Having a strong support network. Spend time with family, friends, or other people you enjoy being around. Express your feelings and talk things over with someone you trust. Counseling or talk therapy with a mental health professional may be helpful if you are having trouble managing stress on your own.   Follow these instructions at home: Lifestyle  Avoid drugs.  Do not use any products that contain nicotine or tobacco, such as cigarettes, e-cigarettes, and chewing tobacco. If you need help quitting, ask your health care provider.  Limit alcohol intake to no more than 1 drink a day for nonpregnant women and 2 drinks a day for men. One drink equals 12 oz of beer, 5 oz of wine, or 1 oz of hard liquor  Do not use alcohol or drugs to relax.  Eat a balanced diet that includes fresh fruits and vegetables, whole grains, lean meats, fish, eggs, and beans, and low-fat dairy. Avoid processed foods and foods high in added fat, sugar, and salt.  Exercise at least 30 minutes on 5 or more days each week.  Get 7-8 hours of sleep each night.   General instructions  Practice stress management techniques as discussed with your health care provider.  Drink enough fluid to keep your urine clear or pale yellow.  Take over-the-counter and prescription medicines only as told by your health care provider.  Keep all follow-up visits as told by your health care provider. This is important.   Contact a health care provider if:  Your symptoms get worse.  You have new symptoms.  You feel overwhelmed by your problems and can no longer manage them on your own. Get help right away if:  You have thoughts of hurting yourself or others. If you ever feel like you may hurt yourself or others, or have thoughts about taking your own life, get help right away. You can go to your nearest emergency department or  call:  Your local emergency services (911 in the [place]  A suicide crisis helpline, such as the [facility] at 613-352-7393. This is open 24 hours a day. Summary  Stress is a normal reaction to life events. It can cause problems if it happens too often or for too long.  Practicing stress management techniques is the best way to treat stress.  Counseling or talk therapy with a mental health professional may be helpful if you are having trouble managing stress on your own. This information is not intended to replace advice given to you by your health care provider. Make sure you discuss any questions you have with your health care provider. Document Revised: 07/27/2020 Document Reviewed: 07/27/2020 Elsevier Patient Education  2021 [employer].

## 2021-02-12 NOTE — Progress Notes (Signed)
   Subjective:    Patient ID: [name], female    DOB: 06/07/1961, 60 y.o.   MRN: [id number]   Chief Complaint: Hypothyroidism   HPI Patient comes in today for recheck of hypothyroidism. Her labs were done on 11/29/20 and her tsh was 7.450. We increased her levothyroxin to 144mcg daily. She says she is doing fine on increase dose. She denies any fatigue, no palpitations or heart racing. She also wanted to discuss her anxiety. Her boyfriend was dx with prostate cancer yesterday and she is very upset. Says she needs something for her nerves. She is currently on effexor 75mg  and has been on that for several years.  GAD 7 : Generalized Anxiety Score 02/12/2021  Nervous, Anxious, on Edge 3  Control/stop worrying 3  Worry too much - different things 3  Trouble relaxing 3  Restless 0  Easily annoyed or irritable 0  Afraid - awful might happen 3  Total GAD 7 Score 15  Anxiety Difficulty Not difficult at all       Review of Systems  Constitutional: Negative for diaphoresis.  Eyes: Negative for pain.  Respiratory: Negative for shortness of breath.   Cardiovascular: Negative for chest pain, palpitations and leg swelling.  Gastrointestinal: Negative for abdominal pain.  Endocrine: Negative for polydipsia.  Skin: Negative for rash.  Neurological: Negative for dizziness, weakness and headaches.  Hematological: Does not bruise/bleed easily.  All other systems reviewed and are negative.      Objective:   Physical Exam Vitals and nursing note reviewed.  Constitutional:      Appearance: Normal appearance.  Cardiovascular:     Rate and Rhythm: Normal rate and regular rhythm.     Heart sounds: Normal heart sounds.  Pulmonary:     Effort: Pulmonary effort is normal.     Breath sounds: Normal breath sounds.  Skin:    General: Skin is warm.  Neurological:     General: No focal deficit present.     Mental Status: She is alert and oriented to person, place, and time.  Psychiatric:         Mood and Affect: Mood normal.        Behavior: Behavior normal.     BP (!) 152/85   Pulse 86   Temp (!) 96.7 F (35.9 C) (Temporal)   Resp 20   Ht 5\' 8"  (1.727 m)   Wt 115 lb (52.2 kg)   SpO2 98%   BMI 17.49 kg/m         Assessment & Plan:  [name] in today with chief complaint of Hypothyroidism and Anxiety   1. Acquired hypothyroidism Is going to have labs down at work and sh ewill let me know what er TSH is.  2. Anxiety Stress management - busPIRone (BUSPAR) 15 MG tablet; Take 1 tablet (15 mg total) by mouth 3 (three) times daily.  Dispense: 90 tablet; Refill: 1  * encouraged to have mammogram ASAP  The above assessment and management plan was discussed with the patient. The patient verbalized understanding of and has agreed to the management plan. Patient is aware to call the clinic if symptoms persist or worsen. Patient is aware when to return to the clinic for a follow-up visit. Patient educated on when it is appropriate to go to the emergency department.   Mary-Margaret Hassell Done, FNP

## 2021-02-22 ENCOUNTER — Other Ambulatory Visit: Payer: Self-pay | Admitting: Nurse Practitioner

## 2021-02-22 DIAGNOSIS — F339 Major depressive disorder, recurrent, unspecified: Secondary | ICD-10-CM

## 2021-05-26 ENCOUNTER — Other Ambulatory Visit: Payer: Self-pay | Admitting: Nurse Practitioner

## 2021-05-26 DIAGNOSIS — F339 Major depressive disorder, recurrent, unspecified: Secondary | ICD-10-CM

## 2021-05-30 ENCOUNTER — Encounter: Payer: Self-pay | Admitting: Nurse Practitioner

## 2021-05-30 ENCOUNTER — Other Ambulatory Visit: Payer: Self-pay

## 2021-05-30 ENCOUNTER — Ambulatory Visit: Payer: BC Managed Care – PPO | Admitting: Nurse Practitioner

## 2021-05-30 VITALS — BP 146/89 | HR 99 | Temp 98.2°F | Resp 20 | Ht 68.0 in | Wt 115.0 lb

## 2021-05-30 DIAGNOSIS — E039 Hypothyroidism, unspecified: Secondary | ICD-10-CM | POA: Diagnosis not present

## 2021-05-30 DIAGNOSIS — F339 Major depressive disorder, recurrent, unspecified: Secondary | ICD-10-CM

## 2021-05-30 DIAGNOSIS — F419 Anxiety disorder, unspecified: Secondary | ICD-10-CM

## 2021-05-30 DIAGNOSIS — E7800 Pure hypercholesterolemia, unspecified: Secondary | ICD-10-CM

## 2021-05-30 DIAGNOSIS — R0989 Other specified symptoms and signs involving the circulatory and respiratory systems: Secondary | ICD-10-CM

## 2021-05-30 DIAGNOSIS — I10 Essential (primary) hypertension: Secondary | ICD-10-CM

## 2021-05-30 MED ORDER — BUSPIRONE HCL 15 MG PO TABS: 15.0000 mg | ORAL_TABLET | Freq: Three times a day (TID) | ORAL | 1 refills | Status: DC

## 2021-05-30 MED ORDER — AMLODIPINE BESYLATE 5 MG PO TABS
5.0000 mg | ORAL_TABLET | Freq: Every day | ORAL | 1 refills | Status: DC
Start: 2021-05-30 — End: 2021-12-02

## 2021-05-30 MED ORDER — VENLAFAXINE HCL ER 75 MG PO CP24
75.0000 mg | ORAL_CAPSULE | Freq: Every day | ORAL | 1 refills | Status: DC
Start: 2021-05-30 — End: 2021-12-02

## 2021-05-30 MED ORDER — LISINOPRIL 40 MG PO TABS: 40.0000 mg | ORAL_TABLET | Freq: Every day | ORAL | 1 refills | Status: DC

## 2021-05-30 MED ORDER — ATORVASTATIN CALCIUM 40 MG PO TABS: 40.0000 mg | ORAL_TABLET | Freq: Every day | ORAL | 1 refills | Status: DC

## 2021-05-30 NOTE — Patient Instructions (Signed)
Gallbladder Eating Plan If you have a gallbladder condition, you may have trouble digesting fats. Eating a low-fat diet can help reduce your symptoms, and may be helpful before and after having surgery to remove your gallbladder (cholecystectomy). Your health care provider may recommend that you work with a diet and nutrition specialist (dietitian) to help you reduce the amount of fat in your diet. What are tips for following this plan? General guidelines Limit your fat intake to less than 30% of your total daily calories. If you eat around 1,800 calories each day, this is less than 60 grams (g) of fat per day. Fat is an important part of a healthy diet. Eating a low-fat diet can make it hard to maintain a healthy body weight. Ask your dietitian how much fat, calories, and other nutrients you need each day. Eat small, frequent meals throughout the day instead of three large meals. Drink at least 8-10 cups of fluid a day. Drink enough fluid to keep your urine clear or pale yellow. Limit alcohol intake to no more than 1 drink a day for nonpregnant women and 2 drinks a day for men. One drink equals 12 oz of beer, 5 oz of wine, or 1 oz of hard liquor. Reading food labels  Check Nutrition Facts on food labels for the amount of fat per serving. Choose foods with less than 3 grams of fat per serving.  Shopping Choose nonfat and low-fat healthy foods. Look for the words "nonfat," "low fat," or "fat free." Avoid buying processed or prepackaged foods. Cooking Cook using low-fat methods, such as baking, broiling, grilling, or boiling. Cook with small amounts of healthy fats, such as olive oil, grapeseed oil, canola oil, or sunflower oil. What foods are recommended? All fresh, frozen, or canned fruits and vegetables. Whole grains. Low-fat or non-fat (skim) milk and yogurt. Lean meat, skinless poultry, fish, eggs, and beans. Low-fat protein supplement powders or drinks. Spices and herbs. What foods are  not recommended? High-fat foods. These include baked goods, fast food, fatty cuts of meat, ice cream, french toast, sweet rolls, pizza, cheese bread, foods covered with butter, creamy sauces, or cheese. Fried foods. These include french fries, tempura, battered fish, breaded chicken, fried breads, and sweets. Foods with strong odors. Foods that cause bloating and gas. Summary A low-fat diet can be helpful if you have a gallbladder condition, or before and after gallbladder surgery. Limit your fat intake to less than 30% of your total daily calories. This is about 60 g of fat if you eat 1,800 calories each day. Eat small, frequent meals throughout the day instead of three large meals. This information is not intended to replace advice given to you by your health care provider. Make sure you discuss any questions you have with your healthcare provider. Document Revised: 06/28/2020 Document Reviewed: 06/28/2020 Elsevier Patient Education  2022 [employer].

## 2021-05-30 NOTE — Progress Notes (Signed)
Subjective:    Patient ID: [name], female    DOB: August 16, 1961, 60 y.o.   MRN: [id number]   Chief Complaint: Medical Management of Chronic Issues (6 mo )    HPI:   1. Primary hypertension No c/o chest pain, sob or headache. Does not check bloodpressure at home. BP Readings from Last 3 Encounters:  05/30/21 (!) 146/89  02/12/21 (!) 152/85  11/29/20 (!) 184/99    2. Acquired hypothyroidism No problems that aware of. We increased levothyroxine to daily. Will repeat labs today. But [name] is still on daily. [name] says [name] never got message. Lab Results  Component Value Date   TSH 7.450 (H) 11/29/2020     3. Depression, recurrent (HCC) Is on effexor daily and is doing well.  No medication side effects. Depression screen [facility] 2/9 05/30/2021 02/12/2021 11/29/2020  Decreased Interest 0 1 0  Down, Depressed, Hopeless 0 3 0  PHQ - 2 Score 0 4 0  Altered sleeping - 3 0  Tired, decreased energy - 3 0  Change in appetite - 3 0  Feeling bad or failure about yourself  - 0 0  Trouble concentrating - 3 0  Moving slowly or fidgety/restless - 0 0  Suicidal thoughts - 0 0  PHQ-9 Score - 16 0  Difficult doing work/chores - Not difficult at all Not difficult at all     4 GAD Is on buspar tid- helps with  her anxiety GAD 7 : Generalized Anxiety Score 02/12/2021  Nervous, Anxious, on Edge 3  Control/stop worrying 3  Worry too much - different things 3  Trouble relaxing 3  Restless 0  Easily annoyed or irritable 0  Afraid - awful might happen 3  Total GAD 7 Score 15  Anxiety Difficulty Not difficult at all     5. hyperlipidemia Was started on lipitor at last visit. [name] has ran out of meds though Lab Results  Component Value Date   CHOL 225 (H) 11/29/2020   HDL 66 11/29/2020   LDLCALC 135 (H) 11/29/2020   TRIG 135 11/29/2020   CHOLHDL 3.4 11/29/2020      Outpatient Encounter Medications as of 05/30/2021  Medication Sig   amLODipine (NORVASC) 5 MG tablet Take  1 tablet (5 mg total) by mouth daily.   atorvastatin (LIPITOR) 40 MG tablet Take 1 tablet (40 mg total) by mouth daily.   Calcium Carbonate-Vitamin D (CALCIUM + D PO) Take by mouth.   Cyanocobalamin (B-12 PO) Take by mouth.   Flaxseed, Linseed, (FLAXSEED OIL PO) Take by mouth.   levothyroxine (SYNTHROID) 100 MCG tablet Take 100 mcg by mouth daily before breakfast.   lisinopril (ZESTRIL) 40 MG tablet Take 1 tablet (40 mg total) by mouth daily.   loratadine (CLARITIN) 10 MG tablet Take 10 mg by mouth daily.   Multiple Vitamin (MULTIVITAMIN) capsule Take 1 capsule by mouth daily.   venlafaxine XR (EFFEXOR-XR) 75 MG 24 hr capsule Take 1 capsule by mouth once daily   busPIRone (BUSPAR) 15 MG tablet Take 1 tablet (15 mg total) by mouth 3 (three) times daily. (Patient not taking: Reported on 05/30/2021)   [DISCONTINUED] levothyroxine (SYNTHROID) 125 MCG tablet Take 1 tablet (125 mcg total) by mouth daily.   No facility-administered encounter medications on file as of 05/30/2021.    Past Surgical History:  Procedure Laterality Date   AMPUTATION Right 06/30/2020   Procedure: Revision of Right middle finger amputation;  Surgeon: Bradly Bienenstock, MD;  Location: [facility]  OR;  Service: Orthopedics;  Laterality: Right;   BREAST SURGERY  04/10/2011   Right mastectomy and slnbx   LYMPH NODE BIOPSY  1996   rt groin   MULTIPLE TOOTH EXTRACTIONS     PORT-A-CATH REMOVAL  2012   insertion and out   RADIOACTIVE SEED GUIDED EXCISIONAL BREAST BIOPSY Left 01/16/2015   Procedure: RADIOACTIVE SEED GUIDED EXCISIONAL BREAST BIOPSY;  Surgeon: Alphonsa Overall, MD;  Location: [place];  Service: General;  Laterality: Left;   TUBAL LIGATION      Family History  Problem Relation Age of Onset   Stroke Mother    Hypertension Mother    Heart disease Mother    Cancer Maternal Grandmother        Breast cancer    New complaints: None today  Social history: Lives with her husband  Controlled substance contract:  n/a       Review of Systems  Constitutional:  Negative for diaphoresis.  Eyes:  Negative for pain.  Respiratory:  Negative for shortness of breath.   Cardiovascular:  Negative for chest pain, palpitations and leg swelling.  Gastrointestinal:  Negative for abdominal pain.  Endocrine: Negative for polydipsia.  Skin:  Negative for rash.  Neurological:  Negative for dizziness, weakness and headaches.  Hematological:  Does not bruise/bleed easily.  All other systems reviewed and are negative.     Objective:   Physical Exam Vitals and nursing note reviewed.  Constitutional:      General: [name] is not in acute distress.    Appearance: Normal appearance. [name] is well-developed.  HENT:     Head: Normocephalic.     Right Ear: Tympanic membrane normal.     Left Ear: Tympanic membrane normal.     Nose: Nose normal.     Mouth/Throat:     Mouth: Mucous membranes are moist.  Eyes:     Pupils: Pupils are equal, round, and reactive to light.  Neck:     Vascular: No carotid bruit or JVD.  Cardiovascular:     Rate and Rhythm: Normal rate and regular rhythm.     Pulses:          Carotid pulses are  on the right side with bruit.    Heart sounds: Normal heart sounds.  Pulmonary:     Effort: Pulmonary effort is normal. No respiratory distress.     Breath sounds: Normal breath sounds. No wheezing or rales.  Chest:     Chest wall: No tenderness.  Abdominal:     General: Bowel sounds are normal. There is no distension or abdominal bruit.     Palpations: Abdomen is soft. There is no hepatomegaly, splenomegaly, mass or pulsatile mass.     Tenderness: There is no abdominal tenderness.  Musculoskeletal:        General: Normal range of motion.     Cervical back: Normal range of motion and neck supple.  Lymphadenopathy:     Cervical: No cervical adenopathy.  Skin:    General: Skin is warm and dry.  Neurological:     Mental Status: [name] is alert and oriented to person, place, and time.      Deep Tendon Reflexes: Reflexes are normal and symmetric.  Psychiatric:        Behavior: Behavior normal.        Thought Content: Thought content normal.        Judgment: Judgment normal.   BP (!) 146/89   Pulse 99   Temp 98.2 F (  36.8 C)   Resp 20   Ht $R'5\' 8"'ca$  (1.727 m)   Wt 115 lb (52.2 kg)   SpO2 94%   BMI 17.49 kg/m         Assessment & Plan:  [name] comes in today with chief complaint of Medical Management of Chronic Issues (6 mo )   Diagnosis and orders addressed:  1. Primary hypertension Low sodium diet - amLODipine (NORVASC) 5 MG tablet; Take 1 tablet (5 mg total) by mouth daily.  Dispense: 90 tablet; Refill: 1 - lisinopril (ZESTRIL) 40 MG tablet; Take 1 tablet (40 mg total) by mouth daily.  Dispense: 90 tablet; Refill: 1 - CBC with Differential/Platelet - CMP14+EGFR  2. Acquired hypothyroidism Labs pending - Thyroid Panel With TSH  3. Depression, recurrent (HCC) Stress management - venlafaxine XR (EFFEXOR-XR) 75 MG 24 hr capsule; Take 1 capsule (75 mg total) by mouth daily.  Dispense: 90 capsule; Refill: 1  4. Anxiety - busPIRone (BUSPAR) 15 MG tablet; Take 1 tablet (15 mg total) by mouth 3 (three) times daily.  Dispense: 90 tablet; Refill: 1  5. Pure hypercholesterolemia Low fat diet - atorvastatin (LIPITOR) 40 MG tablet; Take 1 tablet (40 mg total) by mouth daily.  Dispense: 90 tablet; Refill: 1 - Lipid panel  6. Right carotid bruit - US Carotid Duplex Bilateral; Future   Labs pending Health Maintenance reviewed Diet and exercise encouraged  Follow up plan: 6 months   [place], FNP

## 2021-05-31 LAB — THYROID PANEL WITH TSH
Free Thyroxine Index: 1.7 (ref 1.2–4.9)
T3 Uptake Ratio: 23 % — ABNORMAL LOW (ref 24–39)
T4, Total: 7.6 ug/dL (ref 4.5–12.0)
TSH: 2.75 u[IU]/mL (ref 0.450–4.500)

## 2021-05-31 LAB — CBC WITH DIFFERENTIAL/PLATELET
Basophils Absolute: 0.1 10*3/uL (ref 0.0–0.2)
Basos: 1 %
EOS (ABSOLUTE): 0.1 10*3/uL (ref 0.0–0.4)
Eos: 1 %
Hematocrit: 41.7 % (ref 34.0–46.6)
Hemoglobin: 13.5 g/dL (ref 11.1–15.9)
Immature Grans (Abs): 0 10*3/uL (ref 0.0–0.1)
Immature Granulocytes: 0 %
Lymphocytes Absolute: 3.3 10*3/uL — ABNORMAL HIGH (ref 0.7–3.1)
Lymphs: 39 %
MCH: 29.3 pg (ref 26.6–33.0)
MCHC: 32.4 g/dL (ref 31.5–35.7)
MCV: 91 fL (ref 79–97)
Monocytes Absolute: 0.7 10*3/uL (ref 0.1–0.9)
Monocytes: 8 %
Neutrophils Absolute: 4.3 10*3/uL (ref 1.4–7.0)
Neutrophils: 51 %
Platelets: 319 10*3/uL (ref 150–450)
RBC: 4.61 x10E6/uL (ref 3.77–5.28)
RDW: 11.9 % (ref 11.7–15.4)
WBC: 8.3 10*3/uL (ref 3.4–10.8)

## 2021-05-31 LAB — CMP14+EGFR
ALT: 56 IU/L — ABNORMAL HIGH (ref 0–32)
AST: 35 IU/L (ref 0–40)
Albumin/Globulin Ratio: 1.9 (ref 1.2–2.2)
Albumin: 4.8 g/dL (ref 3.8–4.9)
Alkaline Phosphatase: 129 IU/L — ABNORMAL HIGH (ref 44–121)
BUN/Creatinine Ratio: 16 (ref 9–23)
BUN: 12 mg/dL (ref 6–24)
Bilirubin Total: 0.3 mg/dL (ref 0.0–1.2)
CO2: 24 mmol/L (ref 20–29)
Calcium: 9.9 mg/dL (ref 8.7–10.2)
Chloride: 101 mmol/L (ref 96–106)
Creatinine, Ser: 0.75 mg/dL (ref 0.57–1.00)
Globulin, Total: 2.5 g/dL (ref 1.5–4.5)
Glucose: 100 mg/dL — ABNORMAL HIGH (ref 65–99)
Potassium: 4 mmol/L (ref 3.5–5.2)
Sodium: 140 mmol/L (ref 134–144)
Total Protein: 7.3 g/dL (ref 6.0–8.5)
eGFR: 92 mL/min/{1.73_m2} (ref >59)

## 2021-05-31 LAB — LIPID PANEL
Chol/HDL Ratio: 1.9 ratio (ref 0.0–4.4)
Cholesterol, Total: 164 mg/dL (ref 100–199)
HDL: 86 mg/dL (ref >39)
LDL Chol Calc (NIH): 59 mg/dL (ref 0–99)
Triglycerides: 106 mg/dL (ref 0–149)
VLDL Cholesterol Cal: 19 mg/dL (ref 5–40)

## 2021-06-27 ENCOUNTER — Telehealth: Payer: Self-pay | Admitting: Nurse Practitioner

## 2021-07-01 ENCOUNTER — Ambulatory Visit (HOSPITAL_COMMUNITY)
Admission: RE | Admit: 2021-07-01 | Discharge: 2021-07-01 | Disposition: A | Discharge status: Home / Self Care | Payer: BC Managed Care – PPO | Source: Ambulatory Visit | Attending: Nurse Practitioner | Admitting: Nurse Practitioner

## 2021-07-01 ENCOUNTER — Other Ambulatory Visit: Payer: Self-pay

## 2021-07-01 DIAGNOSIS — R0989 Other specified symptoms and signs involving the circulatory and respiratory systems: Secondary | ICD-10-CM | POA: Diagnosis not present

## 2021-07-01 DIAGNOSIS — I6523 Occlusion and stenosis of bilateral carotid arteries: Secondary | ICD-10-CM | POA: Diagnosis not present

## 2021-08-13 DIAGNOSIS — M7661 Achilles tendinitis, right leg: Secondary | ICD-10-CM | POA: Diagnosis not present

## 2021-09-02 DIAGNOSIS — Z23 Encounter for immunization: Secondary | ICD-10-CM | POA: Diagnosis not present

## 2021-09-10 DIAGNOSIS — M7661 Achilles tendinitis, right leg: Secondary | ICD-10-CM | POA: Diagnosis not present

## 2021-11-08 DIAGNOSIS — H10013 Acute follicular conjunctivitis, bilateral: Secondary | ICD-10-CM | POA: Diagnosis not present

## 2021-11-21 ENCOUNTER — Ambulatory Visit: Payer: BC Managed Care – PPO | Admitting: Nurse Practitioner

## 2021-11-21 ENCOUNTER — Encounter: Payer: Self-pay | Admitting: Nurse Practitioner

## 2021-11-21 DIAGNOSIS — M25552 Pain in left hip: Secondary | ICD-10-CM | POA: Diagnosis not present

## 2021-11-21 MED ORDER — DICLOFENAC SODIUM 1 % EX GEL: 2.0000 g | Freq: Four times a day (QID) | CUTANEOUS | 0 refills | Status: DC

## 2021-11-21 MED ORDER — ACETAMINOPHEN 500 MG PO TABS: 500.0000 mg | ORAL_TABLET | Freq: Four times a day (QID) | ORAL | 0 refills | Status: DC | PRN

## 2021-11-21 MED ORDER — PREDNISONE 10 MG (21) PO TBPK: ORAL_TABLET | ORAL | 0 refills | Status: DC

## 2021-11-21 MED ORDER — METHYLPREDNISOLONE ACETATE 80 MG/ML IJ SUSP
80.0000 mg | Freq: Once | INTRAMUSCULAR | Status: AC
  Administered 2021-11-21: 16:00:00 80 mg via INTRAMUSCULAR

## 2021-11-21 NOTE — Progress Notes (Signed)
° °  Virtual Visit  Note Due to COVID-19 pandemic this visit was conducted virtually. This visit type was conducted due to national recommendations for restrictions regarding the COVID-19 Pandemic (e.g. social distancing, sheltering in place) in an effort to limit this patient's exposure and mitigate transmission in our community. All issues noted in this document were discussed and addressed.  A physical exam was not performed with this format.  I connected with [facility] on 11/21/21 at 3:25 PM by telephone and verified that I am speaking with the correct person using two identifiers. [name] is currently located at Home during visit. The provider, Ivy Lynn, NP is located in their office at time of visit.  I discussed the limitations, risks, security and privacy concerns of performing an evaluation and management service by telephone and the availability of in person appointments. I also discussed with the patient that there may be a patient responsible charge related to this service. The patient expressed understanding and agreed to proceed.   History and Present Illness:  Hip Pain  The incident occurred 3 to 5 days ago. The incident occurred at home. There was no injury mechanism. The pain is present in the left hip. The quality of the pain is described as aching. The pain is at a severity of 8/10. The pain is severe. The pain has been Constant since onset. She reports no foreign bodies present. The symptoms are aggravated by movement. She has tried nothing for the symptoms. The treatment provided no relief.     Review of Systems  Constitutional: Negative.   HENT: Negative.    Respiratory: Negative.    Cardiovascular: Negative.   Musculoskeletal:  Positive for joint pain.  Skin: Negative.   All other systems reviewed and are negative.   Observations/Objective: Televisit patient not in distress.  Assessment and Plan:  Left hip pain Take medication as  prescribed -Wrist joint -Apply ice -Tylenol for pain -80 DepoMedrol shot in clinic -prednisone taper  -Voltaren gel   Follow Up Instructions: Follow-up with worsening unresolved symptoms    I discussed the assessment and treatment plan with the patient. The patient was provided an opportunity to ask questions and all were answered. The patient agreed with the plan and demonstrated an understanding of the instructions.   The patient was advised to call back or seek an in-person evaluation if the symptoms worsen or if the condition fails to improve as anticipated.  The above assessment and management plan was discussed with the patient. The patient verbalized understanding of and has agreed to the management plan. Patient is aware to call the clinic if symptoms persist or worsen. Patient is aware when to return to the clinic for a follow-up visit. Patient educated on when it is appropriate to go to the emergency department.   Time call ended:  3:35 PM I provided 10 minutes of  non face-to-face time during this encounter.    Ivy Lynn, NP

## 2021-11-21 NOTE — Assessment & Plan Note (Addendum)
Left hip pain Take medication as prescribed -Wrist joint -Apply ice -Tylenol for pain -80 DepoMedrol shot in clinic -prednisone taper  -Voltaren gel  Follow-up with worsening unresolved symptoms.

## 2021-11-21 NOTE — Patient Instructions (Signed)
Hip Pain The hip is the joint between the upper legs and the lower pelvis. The bones, cartilage, tendons, and muscles of your hip joint support your body and allow you to move around. Hip pain can range from a minor ache to severe pain in one or both of your hips. The pain may be felt on the inside of the hip joint near the groin, or on the outside near the buttocks and upper thigh. You may also have swelling or stiffness in your hip area. Follow these instructions at home: Managing pain, stiffness, and swelling   If directed, put ice on the painful area. To do this: Put ice in a plastic bag. Place a towel between your skin and the bag. Leave the ice on for 20 minutes, 2-3 times a day. If directed, apply heat to the affected area as often as told by your health care provider. Use the heat source that your health care provider recommends, such as a moist heat pack or a heating pad. Place a towel between your skin and the heat source. Leave the heat on for 20-30 minutes. Remove the heat if your skin turns bright red. This is especially important if you are unable to feel pain, heat, or cold. You may have a greater risk of getting burned. Activity Do exercises as told by your health care provider. Avoid activities that cause pain. General instructions  Take over-the-counter and prescription medicines only as told by your health care provider. Keep a journal of your symptoms. Write down: How often you have hip pain. The location of your pain. What the pain feels like. What makes the pain worse. Sleep with a pillow between your legs on your most comfortable side. Keep all follow-up visits as told by your health care provider. This is important. Contact a health care provider if: You cannot put weight on your leg. Your pain or swelling continues or gets worse after one week. It gets harder to walk. You have a fever. Get help right away if: You fall. You have a sudden increase in pain and  swelling in your hip. Your hip is red or swollen or very tender to touch. Summary Hip pain can range from a minor ache to severe pain in one or both of your hips. The pain may be felt on the inside of the hip joint near the groin, or on the outside near the buttocks and upper thigh. Avoid activities that cause pain. Write down how often you have hip pain, the location of the pain, what makes it worse, and what it feels like. This information is not intended to replace advice given to you by your health care provider. Make sure you discuss any questions you have with your health care provider. Document Revised: 03/28/2019 Document Reviewed: 03/28/2019 Elsevier Patient Education  2022 [employer]  

## 2021-11-29 DIAGNOSIS — M25552 Pain in left hip: Secondary | ICD-10-CM | POA: Diagnosis not present

## 2021-11-29 DIAGNOSIS — S76112A Strain of left quadriceps muscle, fascia and tendon, initial encounter: Secondary | ICD-10-CM | POA: Diagnosis not present

## 2021-12-02 ENCOUNTER — Encounter: Payer: Self-pay | Admitting: Nurse Practitioner

## 2021-12-02 ENCOUNTER — Ambulatory Visit: Payer: BC Managed Care – PPO | Admitting: Nurse Practitioner

## 2021-12-02 VITALS — BP 120/62 | HR 91 | Temp 97.7°F | Resp 20 | Ht 68.0 in | Wt 112.0 lb

## 2021-12-02 DIAGNOSIS — E039 Hypothyroidism, unspecified: Secondary | ICD-10-CM

## 2021-12-02 DIAGNOSIS — I10 Essential (primary) hypertension: Secondary | ICD-10-CM | POA: Diagnosis not present

## 2021-12-02 DIAGNOSIS — R7989 Other specified abnormal findings of blood chemistry: Secondary | ICD-10-CM

## 2021-12-02 DIAGNOSIS — F339 Major depressive disorder, recurrent, unspecified: Secondary | ICD-10-CM

## 2021-12-02 DIAGNOSIS — F419 Anxiety disorder, unspecified: Secondary | ICD-10-CM

## 2021-12-02 DIAGNOSIS — E7800 Pure hypercholesterolemia, unspecified: Secondary | ICD-10-CM

## 2021-12-02 MED ORDER — ATORVASTATIN CALCIUM 40 MG PO TABS: 40.0000 mg | ORAL_TABLET | Freq: Every day | ORAL | 1 refills | Status: DC

## 2021-12-02 MED ORDER — LEVOTHYROXINE SODIUM 100 MCG PO TABS: 100.0000 ug | ORAL_TABLET | Freq: Every day | ORAL | 1 refills | Status: DC

## 2021-12-02 MED ORDER — BUSPIRONE HCL 15 MG PO TABS: 15.0000 mg | ORAL_TABLET | Freq: Three times a day (TID) | ORAL | 1 refills | Status: DC

## 2021-12-02 MED ORDER — VENLAFAXINE HCL ER 75 MG PO CP24: 75.0000 mg | ORAL_CAPSULE | Freq: Every day | ORAL | 1 refills | Status: DC

## 2021-12-02 MED ORDER — AMLODIPINE BESYLATE 5 MG PO TABS: 5.0000 mg | ORAL_TABLET | Freq: Every day | ORAL | 1 refills | Status: DC

## 2021-12-02 MED ORDER — LISINOPRIL 40 MG PO TABS: 40.0000 mg | ORAL_TABLET | Freq: Every day | ORAL | 1 refills | Status: DC

## 2021-12-02 NOTE — Patient Instructions (Signed)

## 2021-12-02 NOTE — Progress Notes (Signed)
Subjective:    Patient ID: [name], female    DOB: Dec 26, 1960, 61 y.o.   MRN: [id number]   Chief Complaint: medical management of chronic issues     HPI:  [name] is a 61 y.o. who identifies as a female who was assigned female at birth.   Social history: Lives with: husband   Comes in today for follow up of the following chronic medical issues:  1. Primary hypertension No c/o chest pain, sob or headache. Does  check blood pressure at homeand runns in 409'W systolic. BP Readings from Last 3 Encounters:  05/30/21 (!) 146/89  02/12/21 (!) 152/85  11/29/20 (!) 184/99     2. Acquired hypothyroidism No problems that aware of Lab Results  Component Value Date   TSH 2.750 05/30/2021     3. Depression, recurrent ([place]) Is on effexor and buspar daily. Say is she is doing well. Depression screen [facility] 2/9 12/02/2021 05/30/2021 02/12/2021  Decreased Interest 0 0 1  Down, Depressed, Hopeless 0 0 3  PHQ - 2 Score 0 0 4  Altered sleeping 0 - 3  Tired, decreased energy 0 - 3  Change in appetite 0 - 3  Feeling bad or failure about yourself  0 - 0  Trouble concentrating 0 - 3  Moving slowly or fidgety/restless 0 - 0  Suicidal thoughts 0 - 0  PHQ-9 Score 0 - 16  Difficult doing work/chores Not difficult at all - Not difficult at all   GAD 7 : Generalized Anxiety Score 12/02/2021 02/12/2021  Nervous, Anxious, on Edge 0 3  Control/stop worrying 0 3  Worry too much - different things 0 3  Trouble relaxing 0 3  Restless 0 0  Easily annoyed or irritable 0 0  Afraid - awful might happen 0 3  Total GAD 7 Score 0 15  Anxiety Difficulty Not difficult at all Not difficult at all       New complaints: None today  Allergies  Allergen Reactions   Sulfa Antibiotics Other (See Comments)    Makes mouth raw   Codeine Nausea And Vomiting   Outpatient Encounter Medications as of 12/02/2021  Medication Sig   acetaminophen (TYLENOL) 500 MG tablet Take 1 tablet (500 mg total)  by mouth every 6 (six) hours as needed.   amLODipine (NORVASC) 5 MG tablet Take 1 tablet (5 mg total) by mouth daily.   atorvastatin (LIPITOR) 40 MG tablet Take 1 tablet (40 mg total) by mouth daily.   busPIRone (BUSPAR) 15 MG tablet Take 1 tablet (15 mg total) by mouth 3 (three) times daily.   Calcium Carbonate-Vitamin D (CALCIUM + D PO) Take by mouth.   Cyanocobalamin (B-12 PO) Take by mouth.   diclofenac Sodium (VOLTAREN) 1 % GEL Apply 2 g topically 4 (four) times daily.   Flaxseed, Linseed, (FLAXSEED OIL PO) Take by mouth.   levothyroxine (SYNTHROID) 100 MCG tablet Take 100 mcg by mouth daily before breakfast.   lisinopril (ZESTRIL) 40 MG tablet Take 1 tablet (40 mg total) by mouth daily.   loratadine (CLARITIN) 10 MG tablet Take 10 mg by mouth daily.   Multiple Vitamin (MULTIVITAMIN) capsule Take 1 capsule by mouth daily.   predniSONE (STERAPRED UNI-PAK 21 TAB) 10 MG (21) TBPK tablet 6 tablets day 1, 5 tablet day 2, 4 tablet day 3, 3 tablet day 4, 2 tablet day 5, 1 tablet day 6   venlafaxine XR (EFFEXOR-XR) 75 MG 24 hr capsule Take 1 capsule (  75 mg total) by mouth daily.   No facility-administered encounter medications on file as of 12/02/2021.    Past Surgical History:  Procedure Laterality Date   AMPUTATION Right 06/30/2020   Procedure: Revision of Right middle finger amputation;  Surgeon: Iran Planas, MD;  Location: [place];  Service: Orthopedics;  Laterality: Right;   BREAST SURGERY  04/10/2011   Right mastectomy and slnbx   LYMPH NODE BIOPSY  1996   rt groin   MULTIPLE TOOTH EXTRACTIONS     PORT-A-CATH REMOVAL  2012   insertion and out   RADIOACTIVE SEED GUIDED EXCISIONAL BREAST BIOPSY Left 01/16/2015   Procedure: RADIOACTIVE SEED GUIDED EXCISIONAL BREAST BIOPSY;  Surgeon: Alphonsa Overall, MD;  Location: [place];  Service: General;  Laterality: Left;   TUBAL LIGATION      Family History  Problem Relation Age of Onset   Stroke Mother    Hypertension Mother     Heart disease Mother    Cancer Maternal Grandmother        Breast cancer      Controlled substance contract: n/a     Review of Systems  Constitutional:  Negative for diaphoresis.  Eyes:  Negative for pain.  Respiratory:  Negative for shortness of breath.   Cardiovascular:  Negative for chest pain, palpitations and leg swelling.  Gastrointestinal:  Negative for abdominal pain.  Endocrine: Negative for polydipsia.  Skin:  Negative for rash.  Neurological:  Negative for dizziness, weakness and headaches.  Hematological:  Does not bruise/bleed easily.  All other systems reviewed and are negative.     Objective:   Physical Exam Vitals and nursing note reviewed.  Constitutional:      General: She is not in acute distress.    Appearance: Normal appearance. She is well-developed.  HENT:     Head: Normocephalic.     Right Ear: Tympanic membrane normal.     Left Ear: Tympanic membrane normal.     Nose: Nose normal.     Mouth/Throat:     Mouth: Mucous membranes are moist.  Eyes:     Pupils: Pupils are equal, round, and reactive to light.  Neck:     Vascular: No carotid bruit or JVD.  Cardiovascular:     Rate and Rhythm: Normal rate and regular rhythm.     Heart sounds: Normal heart sounds.  Pulmonary:     Effort: Pulmonary effort is normal. No respiratory distress.     Breath sounds: Normal breath sounds. No wheezing or rales.  Chest:     Chest wall: No tenderness.  Abdominal:     General: Bowel sounds are normal. There is no distension or abdominal bruit.     Palpations: Abdomen is soft. There is no hepatomegaly, splenomegaly, mass or pulsatile mass.     Tenderness: There is no abdominal tenderness.  Musculoskeletal:        General: Normal range of motion.     Cervical back: Normal range of motion and neck supple.  Lymphadenopathy:     Cervical: No cervical adenopathy.  Skin:    General: Skin is warm and dry.  Neurological:     Mental Status: She is alert and  oriented to person, place, and time.     Deep Tendon Reflexes: Reflexes are normal and symmetric.  Psychiatric:        Behavior: Behavior normal.        Thought Content: Thought content normal.        Judgment: Judgment normal.  BP 120/62 Comment: home   Pulse 91    Temp 97.7 F (36.5 C) (Temporal)    Resp 20    Ht 5' 8" (1.727 m)    Wt 112 lb (50.8 kg)    SpO2 99%    BMI 17.03 kg/m        Assessment & Plan:  [name] comes in today with chief complaint of Medical Management of Chronic Issues   Diagnosis and orders addressed:  1. Primary hypertension Low soium diet - CBC with Differential/Platelet - CMP14+EGFR - Lipid panel - amLODipine (NORVASC) 5 MG tablet; Take 1 tablet (5 mg total) by mouth daily.  Dispense: 90 tablet; Refill: 1 - lisinopril (ZESTRIL) 40 MG tablet; Take 1 tablet (40 mg total) by mouth daily.  Dispense: 90 tablet; Refill: 1  2. Acquired hypothyroidism Labs pending - Thyroid Panel With TSH - levothyroxine (SYNTHROID) 100 MCG tablet; Take 1 tablet (100 mcg total) by mouth daily before breakfast.  Dispense: 90 tablet; Refill: 1  3. Depression, recurrent (HCC) Stress management- venlafaxine XR (EFFEXOR-XR) 75 MG 24 hr capsule; Take 1 capsule (75 mg total) by mouth daily.  Dispense: 90 capsule; Refill: 1  4. Anxiety - busPIRone (BUSPAR) 15 MG tablet; Take 1 tablet (15 mg total) by mouth 3 (three) times daily.  Dispense: 90 tablet; Refill: 1  5. Pure hypercholesterolemia Low fat diet - atorvastatin (LIPITOR) 40 MG tablet; Take 1 tablet (40 mg total) by mouth daily.  Dispense: 90 tablet; Refill: 1   Labs pending Health Maintenance reviewed Diet and exercise encouraged  Follow up plan: 6 months   Mary-Margaret Hassell Done, FNP

## 2021-12-03 LAB — CMP14+EGFR
ALT: 99 IU/L — ABNORMAL HIGH (ref 0–32)
AST: 64 IU/L — ABNORMAL HIGH (ref 0–40)
Albumin/Globulin Ratio: 1.8 (ref 1.2–2.2)
Albumin: 4.6 g/dL (ref 3.8–4.9)
Alkaline Phosphatase: 150 IU/L — ABNORMAL HIGH (ref 44–121)
BUN/Creatinine Ratio: 14 (ref 9–23)
BUN: 10 mg/dL (ref 6–24)
Bilirubin Total: 0.2 mg/dL (ref 0.0–1.2)
CO2: 25 mmol/L (ref 20–29)
Calcium: 9.5 mg/dL (ref 8.7–10.2)
Chloride: 102 mmol/L (ref 96–106)
Creatinine, Ser: 0.74 mg/dL (ref 0.57–1.00)
Globulin, Total: 2.5 g/dL (ref 1.5–4.5)
Glucose: 103 mg/dL — ABNORMAL HIGH (ref 70–99)
Potassium: 3.9 mmol/L (ref 3.5–5.2)
Sodium: 142 mmol/L (ref 134–144)
Total Protein: 7.1 g/dL (ref 6.0–8.5)
eGFR: 93 mL/min/{1.73_m2} (ref >59)

## 2021-12-03 LAB — LIPID PANEL
Chol/HDL Ratio: 1.7 ratio (ref 0.0–4.4)
Cholesterol, Total: 156 mg/dL (ref 100–199)
HDL: 93 mg/dL (ref >39)
LDL Chol Calc (NIH): 46 mg/dL (ref 0–99)
Triglycerides: 97 mg/dL (ref 0–149)
VLDL Cholesterol Cal: 17 mg/dL (ref 5–40)

## 2021-12-03 LAB — THYROID PANEL WITH TSH
Free Thyroxine Index: 1.7 (ref 1.2–4.9)
T3 Uptake Ratio: 25 % (ref 24–39)
T4, Total: 6.9 ug/dL (ref 4.5–12.0)
TSH: 10.2 u[IU]/mL — ABNORMAL HIGH (ref 0.450–4.500)

## 2021-12-03 LAB — CBC WITH DIFFERENTIAL/PLATELET
Basophils Absolute: 0.1 10*3/uL (ref 0.0–0.2)
Basos: 1 %
EOS (ABSOLUTE): 0.1 10*3/uL (ref 0.0–0.4)
Eos: 1 %
Hematocrit: 42.1 % (ref 34.0–46.6)
Hemoglobin: 14.1 g/dL (ref 11.1–15.9)
Immature Grans (Abs): 0.1 10*3/uL (ref 0.0–0.1)
Immature Granulocytes: 1 %
Lymphocytes Absolute: 3 10*3/uL (ref 0.7–3.1)
Lymphs: 23 %
MCH: 30.3 pg (ref 26.6–33.0)
MCHC: 33.5 g/dL (ref 31.5–35.7)
MCV: 91 fL (ref 79–97)
Monocytes Absolute: 1.2 10*3/uL — ABNORMAL HIGH (ref 0.1–0.9)
Monocytes: 9 %
Neutrophils Absolute: 8.5 10*3/uL — ABNORMAL HIGH (ref 1.4–7.0)
Neutrophils: 65 %
Platelets: 333 10*3/uL (ref 150–450)
RBC: 4.65 x10E6/uL (ref 3.77–5.28)
RDW: 12.5 % (ref 11.7–15.4)
WBC: 13 10*3/uL — ABNORMAL HIGH (ref 3.4–10.8)

## 2021-12-03 MED ORDER — LEVOTHYROXINE SODIUM 125 MCG PO TABS
125.0000 ug | ORAL_TABLET | Freq: Every day | ORAL | 3 refills | Status: DC
Start: 2021-12-03 — End: 2022-06-02

## 2021-12-03 NOTE — Addendum Note (Signed)
Addended by: Chevis Pretty on: 12/03/2021 08:21 AM   Modules accepted: Orders

## 2021-12-03 NOTE — Progress Notes (Signed)
Pt calling back

## 2021-12-04 ENCOUNTER — Other Ambulatory Visit: Payer: Self-pay

## 2021-12-04 DIAGNOSIS — E039 Hypothyroidism, unspecified: Secondary | ICD-10-CM

## 2021-12-10 ENCOUNTER — Telehealth: Payer: Self-pay | Admitting: Nurse Practitioner

## 2021-12-12 ENCOUNTER — Ambulatory Visit (HOSPITAL_COMMUNITY)
Admission: RE | Admit: 2021-12-12 | Discharge: 2021-12-12 | Disposition: A | Discharge status: Home / Self Care | Payer: BC Managed Care – PPO | Source: Ambulatory Visit | Attending: Nurse Practitioner | Admitting: Nurse Practitioner

## 2021-12-12 ENCOUNTER — Other Ambulatory Visit (HOSPITAL_COMMUNITY): Payer: BC Managed Care – PPO

## 2021-12-12 ENCOUNTER — Other Ambulatory Visit: Payer: Self-pay

## 2021-12-12 DIAGNOSIS — R7989 Other specified abnormal findings of blood chemistry: Secondary | ICD-10-CM | POA: Diagnosis not present

## 2021-12-12 DIAGNOSIS — R945 Abnormal results of liver function studies: Secondary | ICD-10-CM | POA: Diagnosis not present

## 2021-12-17 NOTE — Telephone Encounter (Signed)
Please see what patient needs

## 2021-12-17 NOTE — Telephone Encounter (Signed)
Patient had US done on 1/19

## 2021-12-30 DIAGNOSIS — M25561 Pain in right knee: Secondary | ICD-10-CM | POA: Diagnosis not present

## 2021-12-30 DIAGNOSIS — M545 Low back pain, unspecified: Secondary | ICD-10-CM | POA: Diagnosis not present

## 2022-01-06 ENCOUNTER — Ambulatory Visit (INDEPENDENT_AMBULATORY_CARE_PROVIDER_SITE_OTHER): Payer: BC Managed Care – PPO | Admitting: Family Medicine

## 2022-01-06 ENCOUNTER — Encounter: Payer: Self-pay | Admitting: Family Medicine

## 2022-01-06 DIAGNOSIS — M545 Low back pain, unspecified: Secondary | ICD-10-CM | POA: Diagnosis not present

## 2022-01-06 DIAGNOSIS — Z91199 Patient's noncompliance with other medical treatment and regimen due to unspecified reason: Secondary | ICD-10-CM

## 2022-01-06 NOTE — Progress Notes (Signed)
Called x2 12:57pm (LVM); 1:04pm (LVM) Patient called back and cancelled  [name], [name] [phone]

## 2022-02-25 DIAGNOSIS — L820 Inflamed seborrheic keratosis: Secondary | ICD-10-CM | POA: Diagnosis not present

## 2022-03-18 DIAGNOSIS — H40033 Anatomical narrow angle, bilateral: Secondary | ICD-10-CM | POA: Diagnosis not present

## 2022-03-18 DIAGNOSIS — H5213 Myopia, bilateral: Secondary | ICD-10-CM | POA: Diagnosis not present

## 2022-05-28 NOTE — Patient Instructions (Addendum)
  Cologuard ° °Your provider has prescribed Cologuard, an easy-to-use, noninvasive test for colon cancer screening, based on the latest advances in stool DNA science.  ° °Here's what will happen next: ° °1. You may receive a call or email from [facility] to confirm your mailing address and insurance information °2. Your kit will be shipped directly to you °3. You collect your stool sample in the privacy of your own home. Please follow the instructions that come with the kit °4. You return the kit via UPS prepaid shipping or pick-up, in the same box it arrived in °5. You should receive a call with the results once they are available. If you do not receive a call, please contact our office at [phone] ° °Insurance Coverage ° °Cologuard is covered by Medicare and most major insurers °Cologuard is covered by Medicare and Medicare Advantage with no co-pay or deductible for eligible patients ages 50-85. °Nationwide, more than 94% of Cologuard patients have no out-of-pocket cost for screening. °• Based on the Affordable Care Act, Cologuard should be covered by most private insurers with no co-pay or deductible for eligible patients (ages 50-75; at average risk for colon cancer; without symptoms). °Currently, ~74% of Cologuard patients 45-49 have had no out-of-pocket cost for screening.  °• Many national and regional payers have begun paying for CRC screening at 45. [facility] continues to work with payers to expand coverage and access for patients ages 45-49.  ° °Only your healthcare insurance provider can confirm how Cologuard will be covered for you. If you have questions about coverage, you can contact your insurance company directly or ask the specialists at [facility] to do that for you. A Customer Support Specialist can be reached at [phone].   ° °Patient Support °Screening for colon cancer is very important to your good health, so if you have any questions at all, please call [facility] Customer Support Specialists at [phone]. They are available 24 hours a day, 6 days a week. An instructional video is available to view online at [email or web address]  ° °*Information and graphics obtained from [email or web address] ° °

## 2022-06-02 ENCOUNTER — Encounter: Payer: Self-pay | Admitting: Nurse Practitioner

## 2022-06-02 ENCOUNTER — Ambulatory Visit: Payer: BC Managed Care – PPO | Admitting: Nurse Practitioner

## 2022-06-02 VITALS — BP 134/81 | HR 102 | Temp 97.8°F | Resp 20 | Ht 68.0 in | Wt 109.0 lb

## 2022-06-02 DIAGNOSIS — Z171 Estrogen receptor negative status [ER-]: Secondary | ICD-10-CM

## 2022-06-02 DIAGNOSIS — Z1211 Encounter for screening for malignant neoplasm of colon: Secondary | ICD-10-CM

## 2022-06-02 DIAGNOSIS — F339 Major depressive disorder, recurrent, unspecified: Secondary | ICD-10-CM

## 2022-06-02 DIAGNOSIS — E039 Hypothyroidism, unspecified: Secondary | ICD-10-CM

## 2022-06-02 DIAGNOSIS — C50011 Malignant neoplasm of nipple and areola, right female breast: Secondary | ICD-10-CM | POA: Diagnosis not present

## 2022-06-02 DIAGNOSIS — I10 Essential (primary) hypertension: Secondary | ICD-10-CM

## 2022-06-02 DIAGNOSIS — F419 Anxiety disorder, unspecified: Secondary | ICD-10-CM

## 2022-06-02 DIAGNOSIS — E7800 Pure hypercholesterolemia, unspecified: Secondary | ICD-10-CM

## 2022-06-02 MED ORDER — LISINOPRIL 40 MG PO TABS: 40.0000 mg | ORAL_TABLET | Freq: Every day | ORAL | 1 refills | Status: DC

## 2022-06-02 MED ORDER — ATORVASTATIN CALCIUM 40 MG PO TABS: 40.0000 mg | ORAL_TABLET | Freq: Every day | ORAL | 1 refills | Status: DC

## 2022-06-02 MED ORDER — LEVOTHYROXINE SODIUM 100 MCG PO TABS: 100.0000 ug | ORAL_TABLET | Freq: Every day | ORAL | 1 refills | Status: DC

## 2022-06-02 MED ORDER — BUSPIRONE HCL 15 MG PO TABS: 15.0000 mg | ORAL_TABLET | Freq: Three times a day (TID) | ORAL | 1 refills | Status: DC

## 2022-06-02 MED ORDER — AMLODIPINE BESYLATE 5 MG PO TABS: 5.0000 mg | ORAL_TABLET | Freq: Every day | ORAL | 1 refills | Status: DC

## 2022-06-02 MED ORDER — VENLAFAXINE HCL ER 75 MG PO CP24: 75.0000 mg | ORAL_CAPSULE | Freq: Every day | ORAL | 1 refills | Status: DC

## 2022-06-02 NOTE — Progress Notes (Signed)
Subjective:    Patient ID: [name], female    DOB: May 25, 1961, 61 y.o.   MRN: [id number]   Chief Complaint: medical management of chronic issues     HPI:  [name] is a 61 y.o. who identifies as a female who was assigned female at birth.   Social history: Lives with: husband Work history: [employer]   Comes in today for follow up of the following chronic medical issues:  1. Primary hypertension No c/o chest pain, sob or headache. Does not check blood pressure at home. BP Readings from Last 3 Encounters:  12/02/21 120/62  05/30/21 (!) 146/89  02/12/21 (!) 152/85     2. Acquired hypothyroidism No problems tat she is aware of.  3. Depression, recurrent ([place]) Is on effexor XR and buspar. Is doing well.    06/02/2022    2:50 PM 12/02/2021    3:35 PM 05/30/2021    3:52 PM  Depression screen PHQ 2/9  Decreased Interest 0 0 0  Down, Depressed, Hopeless 0 0 0  PHQ - 2 Score 0 0 0  Altered sleeping 0 0   Tired, decreased energy 0 0   Change in appetite 0 0   Feeling bad or failure about yourself  0 0   Trouble concentrating 0 0   Moving slowly or fidgety/restless 0 0   Suicidal thoughts 0 0   PHQ-9 Score 0 0   Difficult doing work/chores Not difficult at all Not difficult at all       06/02/2022    2:50 PM 12/02/2021    3:35 PM 02/12/2021    2:20 PM  GAD 7 : Generalized Anxiety Score  Nervous, Anxious, on Edge 0 0 3  Control/stop worrying 0 0 3  Worry too much - different things 0 0 3  Trouble relaxing 0 0 3  Restless 0 0 0  Easily annoyed or irritable 0 0 0  Afraid - awful might happen 0 0 3  Total GAD 7 Score 0 0 15  Anxiety Difficulty Not difficult at all Not difficult at all Not difficult at all      4. smoker Smokes a pack a day. Started with nicotine patch today.  5. Malignant neoplasm of nipple of right breast in female, estrogen receptor negative ([place]) Completed all treatments. Yearly mammograms of remaining breast have been normal but she  has not had one in awile.   New complaints: Has insomnia- she takes her buspar at bedtime and that does not help.  Allergies  Allergen Reactions   Sulfa Antibiotics Other (See Comments)    Makes mouth raw   Codeine Nausea And Vomiting   Outpatient Encounter Medications as of 06/02/2022  Medication Sig   acetaminophen (TYLENOL) 500 MG tablet Take 1 tablet (500 mg total) by mouth every 6 (six) hours as needed.   amLODipine (NORVASC) 5 MG tablet Take 1 tablet (5 mg total) by mouth daily.   atorvastatin (LIPITOR) 40 MG tablet Take 1 tablet (40 mg total) by mouth daily.   busPIRone (BUSPAR) 15 MG tablet Take 1 tablet (15 mg total) by mouth 3 (three) times daily.   Calcium Carbonate-Vitamin D (CALCIUM + D PO) Take by mouth.   Cyanocobalamin (B-12 PO) Take by mouth.   Flaxseed, Linseed, (FLAXSEED OIL PO) Take by mouth.   levothyroxine (SYNTHROID) 125 MCG tablet Take 1 tablet (125 mcg total) by mouth daily.   lisinopril (ZESTRIL) 40 MG tablet Take 1 tablet (40 mg total) by  mouth daily.   loratadine (CLARITIN) 10 MG tablet Take 10 mg by mouth daily.   Multiple Vitamin (MULTIVITAMIN) capsule Take 1 capsule by mouth daily.   venlafaxine XR (EFFEXOR-XR) 75 MG 24 hr capsule Take 1 capsule (75 mg total) by mouth daily.   No facility-administered encounter medications on file as of 06/02/2022.    Past Surgical History:  Procedure Laterality Date   AMPUTATION Right 06/30/2020   Procedure: Revision of Right middle finger amputation;  Surgeon: Iran Planas, MD;  Location: [place];  Service: Orthopedics;  Laterality: Right;   BREAST SURGERY  04/10/2011   Right mastectomy and slnbx   LYMPH NODE BIOPSY  1996   rt groin   MULTIPLE TOOTH EXTRACTIONS     PORT-A-CATH REMOVAL  2012   insertion and out   RADIOACTIVE SEED GUIDED EXCISIONAL BREAST BIOPSY Left 01/16/2015   Procedure: RADIOACTIVE SEED GUIDED EXCISIONAL BREAST BIOPSY;  Surgeon: Alphonsa Overall, MD;  Location: [place];  Service:  General;  Laterality: Left;   TUBAL LIGATION      Family History  Problem Relation Age of Onset   Stroke Mother    Hypertension Mother    Heart disease Mother    Cancer Maternal Grandmother        Breast cancer      Controlled substance contract: n/a     Review of Systems  Constitutional:  Negative for diaphoresis.  Eyes:  Negative for pain.  Respiratory:  Negative for shortness of breath.   Cardiovascular:  Negative for chest pain, palpitations and leg swelling.  Gastrointestinal:  Negative for abdominal pain.  Endocrine: Negative for polydipsia.  Skin:  Negative for rash.  Neurological:  Negative for dizziness, weakness and headaches.  Hematological:  Does not bruise/bleed easily.  All other systems reviewed and are negative.      Objective:   Physical Exam Vitals and nursing note reviewed.  Constitutional:      General: She is not in acute distress.    Appearance: Normal appearance. She is well-developed.  HENT:     Head: Normocephalic.     Right Ear: Tympanic membrane normal.     Left Ear: Tympanic membrane normal.     Nose: Nose normal.     Mouth/Throat:     Mouth: Mucous membranes are moist.  Eyes:     Pupils: Pupils are equal, round, and reactive to light.  Neck:     Vascular: No carotid bruit or JVD.  Cardiovascular:     Rate and Rhythm: Normal rate and regular rhythm.     Heart sounds: Normal heart sounds.  Pulmonary:     Effort: Pulmonary effort is normal. No respiratory distress.     Breath sounds: Normal breath sounds. No wheezing or rales.  Chest:     Chest wall: No tenderness.  Abdominal:     General: Bowel sounds are normal. There is no distension or abdominal bruit.     Palpations: Abdomen is soft. There is no hepatomegaly, splenomegaly, mass or pulsatile mass.     Tenderness: There is no abdominal tenderness.  Musculoskeletal:        General: Normal range of motion.     Cervical back: Normal range of motion and neck supple.   Lymphadenopathy:     Cervical: No cervical adenopathy.  Skin:    General: Skin is warm and dry.  Neurological:     Mental Status: She is alert and oriented to person, place, and time.     Deep  Tendon Reflexes: Reflexes are normal and symmetric.  Psychiatric:        Behavior: Behavior normal.        Thought Content: Thought content normal.        Judgment: Judgment normal.    BP 134/81   Pulse (!) 102   Temp 97.8 F (36.6 C) (Temporal)   Resp 20   Ht _0  (1.727 m)   Wt 109 lb (49.4 kg)   SpO2 97%   BMI 16.57 kg/m         Assessment & Plan:  CHARLISA CHAM comes in today with chief complaint of Medical Management of Chronic Issues   Diagnosis and orders addressed:  1. Primary hypertension Low sodium diet - amLODipine (NORVASC) 5 MG tablet; Take 1 tablet (5 mg total) by mouth daily.  Dispense: 90 tablet; Refill: 1 - lisinopril (ZESTRIL) 40 MG tablet; Take 1 tablet (40 mg total) by mouth daily.  Dispense: 90 tablet; Refill: 1 - CBC with Differential/Platelet - CMP14+EGFR  2. Acquired hypothyroidism Labs pending - levothyroxine (SYNTHROID) 100 MCG tablet; Take 1 tablet (100 mcg total) by mouth daily.  Dispense: 90 tablet; Refill: 1 - Thyroid Panel With TSH  3. Depression, recurrent ([facility]) Stress management - venlafaxine XR (EFFEXOR-XR) 75 MG 24 hr capsule; Take 1 capsule (75 mg total) by mouth daily.  Dispense: 90 capsule; Refill: 1  4. Malignant neoplasm of nipple of right breast in female, estrogen receptor negative ([place]) Needs to have mammogram  5. Anxiety Stress management - busPIRone (BUSPAR) 15 MG tablet; Take 1 tablet (15 mg total) by mouth 3 (three) times daily.  Dispense: 90 tablet; Refill: 1  6. Pure hypercholesterolemia Low fat diet - atorvastatin (LIPITOR) 40 MG tablet; Take 1 tablet (40 mg total) by mouth daily.  Dispense: 90 tablet; Refill: 1 - Lipid panel  7. Screening for colon cancer - Cologuard   Labs pending Health Maintenance  reviewed Diet and exercise encouraged  Follow up plan: 6 months   Mary-Margaret Hassell Done, FNP

## 2022-06-03 LAB — LIPID PANEL
Chol/HDL Ratio: 1.9 ratio (ref 0.0–4.4)
Cholesterol, Total: 168 mg/dL (ref 100–199)
HDL: 90 mg/dL (ref >39)
LDL Chol Calc (NIH): 58 mg/dL (ref 0–99)
Triglycerides: 116 mg/dL (ref 0–149)
VLDL Cholesterol Cal: 20 mg/dL (ref 5–40)

## 2022-06-03 LAB — CMP14+EGFR
ALT: 83 IU/L — ABNORMAL HIGH (ref 0–32)
AST: 67 IU/L — ABNORMAL HIGH (ref 0–40)
Albumin/Globulin Ratio: 1.8 (ref 1.2–2.2)
Albumin: 4.7 g/dL (ref 3.8–4.9)
Alkaline Phosphatase: 146 IU/L — ABNORMAL HIGH (ref 44–121)
BUN/Creatinine Ratio: 9 — ABNORMAL LOW (ref 12–28)
BUN: 8 mg/dL (ref 8–27)
Bilirubin Total: 0.3 mg/dL (ref 0.0–1.2)
CO2: 22 mmol/L (ref 20–29)
Calcium: 9.8 mg/dL (ref 8.7–10.3)
Chloride: 100 mmol/L (ref 96–106)
Creatinine, Ser: 0.89 mg/dL (ref 0.57–1.00)
Globulin, Total: 2.6 g/dL (ref 1.5–4.5)
Glucose: 101 mg/dL — ABNORMAL HIGH (ref 70–99)
Potassium: 4.2 mmol/L (ref 3.5–5.2)
Sodium: 141 mmol/L (ref 134–144)
Total Protein: 7.3 g/dL (ref 6.0–8.5)
eGFR: 74 mL/min/{1.73_m2} (ref >59)

## 2022-06-03 LAB — CBC WITH DIFFERENTIAL/PLATELET
Basophils Absolute: 0 10*3/uL (ref 0.0–0.2)
Basos: 0 %
EOS (ABSOLUTE): 0 10*3/uL (ref 0.0–0.4)
Eos: 0 %
Hematocrit: 39.6 % (ref 34.0–46.6)
Hemoglobin: 13.4 g/dL (ref 11.1–15.9)
Immature Grans (Abs): 0 10*3/uL (ref 0.0–0.1)
Immature Granulocytes: 0 %
Lymphocytes Absolute: 2.6 10*3/uL (ref 0.7–3.1)
Lymphs: 29 %
MCH: 30 pg (ref 26.6–33.0)
MCHC: 33.8 g/dL (ref 31.5–35.7)
MCV: 89 fL (ref 79–97)
Monocytes Absolute: 0.7 10*3/uL (ref 0.1–0.9)
Monocytes: 7 %
Neutrophils Absolute: 5.6 10*3/uL (ref 1.4–7.0)
Neutrophils: 64 %
Platelets: 301 10*3/uL (ref 150–450)
RBC: 4.47 x10E6/uL (ref 3.77–5.28)
RDW: 12.9 % (ref 11.7–15.4)
WBC: 8.9 10*3/uL (ref 3.4–10.8)

## 2022-06-03 LAB — THYROID PANEL WITH TSH
Free Thyroxine Index: 2.2 (ref 1.2–4.9)
T3 Uptake Ratio: 27 % (ref 24–39)
T4, Total: 8.1 ug/dL (ref 4.5–12.0)
TSH: 1.97 u[IU]/mL (ref 0.450–4.500)

## 2022-09-09 DIAGNOSIS — Z23 Encounter for immunization: Secondary | ICD-10-CM | POA: Diagnosis not present

## 2022-11-11 ENCOUNTER — Other Ambulatory Visit: Payer: Self-pay | Admitting: Nurse Practitioner

## 2022-11-11 DIAGNOSIS — F339 Major depressive disorder, recurrent, unspecified: Secondary | ICD-10-CM

## 2022-11-11 DIAGNOSIS — E7800 Pure hypercholesterolemia, unspecified: Secondary | ICD-10-CM

## 2022-11-11 DIAGNOSIS — I10 Essential (primary) hypertension: Secondary | ICD-10-CM

## 2022-11-20 DIAGNOSIS — R52 Pain, unspecified: Secondary | ICD-10-CM | POA: Diagnosis not present

## 2022-11-20 DIAGNOSIS — G4452 New daily persistent headache (NDPH): Secondary | ICD-10-CM | POA: Diagnosis not present

## 2022-11-20 DIAGNOSIS — R059 Cough, unspecified: Secondary | ICD-10-CM | POA: Diagnosis not present

## 2022-11-20 DIAGNOSIS — R509 Fever, unspecified: Secondary | ICD-10-CM | POA: Diagnosis not present

## 2022-11-26 ENCOUNTER — Other Ambulatory Visit: Payer: Self-pay | Admitting: Nurse Practitioner

## 2022-11-26 DIAGNOSIS — E039 Hypothyroidism, unspecified: Secondary | ICD-10-CM

## 2022-12-04 ENCOUNTER — Ambulatory Visit (INDEPENDENT_AMBULATORY_CARE_PROVIDER_SITE_OTHER): Payer: BC Managed Care – PPO | Admitting: Nurse Practitioner

## 2022-12-04 ENCOUNTER — Encounter: Payer: Self-pay | Admitting: Nurse Practitioner

## 2022-12-04 VITALS — BP 131/77 | HR 113 | Temp 97.7°F | Resp 20 | Ht 68.0 in | Wt 104.0 lb

## 2022-12-04 DIAGNOSIS — E039 Hypothyroidism, unspecified: Secondary | ICD-10-CM

## 2022-12-04 DIAGNOSIS — Z171 Estrogen receptor negative status [ER-]: Secondary | ICD-10-CM

## 2022-12-04 DIAGNOSIS — F339 Major depressive disorder, recurrent, unspecified: Secondary | ICD-10-CM

## 2022-12-04 DIAGNOSIS — C50011 Malignant neoplasm of nipple and areola, right female breast: Secondary | ICD-10-CM

## 2022-12-04 DIAGNOSIS — F419 Anxiety disorder, unspecified: Secondary | ICD-10-CM

## 2022-12-04 DIAGNOSIS — I10 Essential (primary) hypertension: Secondary | ICD-10-CM

## 2022-12-04 DIAGNOSIS — E7800 Pure hypercholesterolemia, unspecified: Secondary | ICD-10-CM

## 2022-12-04 MED ORDER — AMLODIPINE BESYLATE 5 MG PO TABS: 5.0000 mg | ORAL_TABLET | Freq: Every day | ORAL | 1 refills | Status: DC

## 2022-12-04 MED ORDER — BUSPIRONE HCL 15 MG PO TABS: 15.0000 mg | ORAL_TABLET | Freq: Three times a day (TID) | ORAL | 1 refills | Status: DC

## 2022-12-04 MED ORDER — ATORVASTATIN CALCIUM 40 MG PO TABS: 40.0000 mg | ORAL_TABLET | Freq: Every day | ORAL | 1 refills | Status: DC

## 2022-12-04 MED ORDER — LEVOTHYROXINE SODIUM 100 MCG PO TABS: 100.0000 ug | ORAL_TABLET | Freq: Every day | ORAL | 1 refills | Status: DC

## 2022-12-04 MED ORDER — VENLAFAXINE HCL ER 75 MG PO CP24: 75.0000 mg | ORAL_CAPSULE | Freq: Every day | ORAL | 1 refills | Status: DC

## 2022-12-04 MED ORDER — LISINOPRIL 40 MG PO TABS: 40.0000 mg | ORAL_TABLET | Freq: Every day | ORAL | 0 refills | Status: DC

## 2022-12-04 NOTE — Patient Instructions (Signed)

## 2022-12-04 NOTE — Progress Notes (Signed)
Subjective:    Patient ID: [name], female    DOB: June 23, 1961, 62 y.o.   MRN: [id number]   Chief Complaint: medical management of chronic issues     HPI:  [name] is a 62 y.o. who identifies as a female who was assigned female at birth.   Social history: Lives with: husband Work history: works at [employer] in today for follow up of the following chronic medical issues:  1. Primary hypertension No c/o chest pain, sob or headache. Does not check blood pressure at home. BP Readings from Last 3 Encounters:  06/02/22 134/81  12/02/21 120/62  05/30/21 (!) 146/89     2. Acquired hypothyroidism No issues that she is awareof. Lab Results  Component Value Date   TSH 1.970 06/02/2022     3. Depression, recurrent ([place]) Has been on effexor for several years. Says she is doing well. Takes buspar as needed    12/04/2022    1:48 PM 06/02/2022    2:50 PM 12/02/2021    3:35 PM  Depression screen PHQ 2/9  Decreased Interest 0 0 0  Down, Depressed, Hopeless 0 0 0  PHQ - 2 Score 0 0 0  Altered sleeping 0 0 0  Tired, decreased energy 0 0 0  Change in appetite 0 0 0  Feeling bad or failure about yourself  0 0 0  Trouble concentrating 0 0 0  Moving slowly or fidgety/restless 0 0 0  Suicidal thoughts 0 0 0  PHQ-9 Score 0 0 0  Difficult doing work/chores Not difficult at all Not difficult at all Not difficult at all      12/04/2022    1:48 PM 06/02/2022    2:50 PM 12/02/2021    3:35 PM 02/12/2021    2:20 PM  GAD 7 : Generalized Anxiety Score  Nervous, Anxious, on Edge 0 0 0 3  Control/stop worrying 0 0 0 3  Worry too much - different things 0 0 0 3  Trouble relaxing 0 0 0 3  Restless 0 0 0 0  Easily annoyed or irritable 0 0 0 0  Afraid - awful might happen 0 0 0 3  Total GAD 7 Score 0 0 0 15  Anxiety Difficulty Not difficult at all Not difficult at all Not difficult at all Not difficult at all       4. Malignant neoplasm of nipple of right breast in  female, estrogen receptor negative ([place]) Breast cancer was 07/2010. No recent reoccurrence. She had mastectomy on right. Mammogram the other side was normal   New complaints: None today  Allergies  Allergen Reactions   Sulfa Antibiotics Other (See Comments)    Makes mouth raw   Codeine Nausea And Vomiting   Outpatient Encounter Medications as of 12/04/2022  Medication Sig   acetaminophen (TYLENOL) 500 MG tablet Take 1 tablet (500 mg total) by mouth every 6 (six) hours as needed.   amLODipine (NORVASC) 5 MG tablet Take 1 tablet (5 mg total) by mouth daily.   atorvastatin (LIPITOR) 40 MG tablet TAKE 1 TABLET BY MOUTH DAILY   busPIRone (BUSPAR) 15 MG tablet Take 1 tablet (15 mg total) by mouth 3 (three) times daily.   Calcium Carbonate-Vitamin D (CALCIUM + D PO) Take by mouth.   Cyanocobalamin (B-12 PO) Take by mouth.   Flaxseed, Linseed, (FLAXSEED OIL PO) Take by mouth. (Patient not taking: Reported on 06/02/2022)   levothyroxine (SYNTHROID) 100 MCG tablet TAKE 1  TABLET BY MOUTH DAILY   lisinopril (ZESTRIL) 40 MG tablet TAKE 1 TABLET BY MOUTH DAILY   loratadine (CLARITIN) 10 MG tablet Take 10 mg by mouth daily.   Multiple Vitamin (MULTIVITAMIN) capsule Take 1 capsule by mouth daily.   venlafaxine XR (EFFEXOR-XR) 75 MG 24 hr capsule TAKE 1 CAPSULE BY MOUTH DAILY   No facility-administered encounter medications on file as of 12/04/2022.    Past Surgical History:  Procedure Laterality Date   AMPUTATION Right 06/30/2020   Procedure: Revision of Right middle finger amputation;  Surgeon: Iran Planas, MD;  Location: [facility];  Service: Orthopedics;  Laterality: Right;   BREAST SURGERY  04/10/2011   Right mastectomy and slnbx   LYMPH NODE BIOPSY  1996   rt groin   MULTIPLE TOOTH EXTRACTIONS     PORT-A-CATH REMOVAL  2012   insertion and out   RADIOACTIVE SEED GUIDED EXCISIONAL BREAST BIOPSY Left 01/16/2015   Procedure: RADIOACTIVE SEED GUIDED EXCISIONAL BREAST BIOPSY;  Surgeon: Alphonsa Overall,  MD;  Location: [facility];  Service: General;  Laterality: Left;   TUBAL LIGATION      Family History  Problem Relation Age of Onset   Stroke Mother    Hypertension Mother    Heart disease Mother    Cancer Maternal Grandmother        Breast cancer      Controlled substance contract: n/a     Review of Systems  Constitutional:  Negative for diaphoresis.  Eyes:  Negative for pain.  Respiratory:  Negative for shortness of breath.   Cardiovascular:  Negative for chest pain, palpitations and leg swelling.  Gastrointestinal:  Negative for abdominal pain.  Endocrine: Negative for polydipsia.  Skin:  Negative for rash.  Neurological:  Negative for dizziness, weakness and headaches.  Hematological:  Does not bruise/bleed easily.  All other systems reviewed and are negative.      Objective:   Physical Exam Vitals and nursing note reviewed.  Constitutional:      General: She is not in acute distress.    Appearance: Normal appearance. She is well-developed.  HENT:     Head: Normocephalic.     Right Ear: Tympanic membrane normal.     Left Ear: Tympanic membrane normal.     Nose: Nose normal.     Mouth/Throat:     Mouth: Mucous membranes are moist.  Eyes:     Pupils: Pupils are equal, round, and reactive to light.  Neck:     Vascular: No carotid bruit or JVD.  Cardiovascular:     Rate and Rhythm: Normal rate and regular rhythm.     Heart sounds: Normal heart sounds.  Pulmonary:     Effort: Pulmonary effort is normal. No respiratory distress.     Breath sounds: Normal breath sounds. No wheezing or rales.  Chest:     Chest wall: No tenderness.  Abdominal:     General: Bowel sounds are normal. There is no distension or abdominal bruit.     Palpations: Abdomen is soft. There is no hepatomegaly, splenomegaly, mass or pulsatile mass.     Tenderness: There is no abdominal tenderness.  Musculoskeletal:        General: Normal range of motion.     Cervical back:  Normal range of motion and neck supple.  Lymphadenopathy:     Cervical: No cervical adenopathy.  Skin:    General: Skin is warm and dry.  Neurological:     Mental Status: She is alert  and oriented to person, place, and time.     Deep Tendon Reflexes: Reflexes are normal and symmetric.  Psychiatric:        Behavior: Behavior normal.        Thought Content: Thought content normal.        Judgment: Judgment normal.    BP 131/77   Pulse (!) 113   Temp 97.7 F (36.5 C) (Temporal)   Resp 20   Ht '5\' 8"'$  (1.727 m)   Wt 104 lb (47.2 kg)   SpO2 96%   BMI 15.81 kg/m         Assessment & Plan:  [name] comes in today with chief complaint of Medical Management of Chronic Issues   Diagnosis and orders addressed:  1. Primary hypertension Low sodium diet - lisinopril (ZESTRIL) 40 MG tablet; Take 1 tablet (40 mg total) by mouth daily.  Dispense: 90 tablet; Refill: 0 - amLODipine (NORVASC) 5 MG tablet; Take 1 tablet (5 mg total) by mouth daily.  Dispense: 90 tablet; Refill: 1 - CBC with Differential/Platelet - CMP14+EGFR  2. Acquired hypothyroidism Labs pending - levothyroxine (SYNTHROID) 100 MCG tablet; Take 1 tablet (100 mcg total) by mouth daily.  Dispense: 90 tablet; Refill: 1 - Thyroid Panel With TSH  3. Depression, recurrent ([facility]) Stress management - venlafaxine XR (EFFEXOR-XR) 75 MG 24 hr capsule; Take 1 capsule (75 mg total) by mouth daily.  Dispense: 90 capsule; Refill: 1  4. Malignant neoplasm of nipple of right breast in female, estrogen receptor negative ([place]) Order mammogram le- busPIRone (BUSPAR) 15 MG tablet; Take 1 tablet (15 mg total) by mouth 3 (three) times daily.  Dispense: 90 tablet; Refill: 1  6. Pure hypercholesterolemia Low fat diet - atorvastatin (LIPITOR) 40 MG tablet; Take 1 tablet (40 mg total) by mouth daily.  Dispense: 90 tablet; Refill: 1 - Lipid panel   Labs pending Health Maintenance reviewed Diet and exercise encouraged  Follow up  plan: 6 months   [place], FNP

## 2022-12-05 LAB — CBC WITH DIFFERENTIAL/PLATELET
Basophils Absolute: 0.1 10*3/uL (ref 0.0–0.2)
Basos: 1 %
EOS (ABSOLUTE): 0.1 10*3/uL (ref 0.0–0.4)
Eos: 1 %
Hematocrit: 38.2 % (ref 34.0–46.6)
Hemoglobin: 12.9 g/dL (ref 11.1–15.9)
Immature Grans (Abs): 0 10*3/uL (ref 0.0–0.1)
Immature Granulocytes: 0 %
Lymphocytes Absolute: 2.5 10*3/uL (ref 0.7–3.1)
Lymphs: 27 %
MCH: 31.4 pg (ref 26.6–33.0)
MCHC: 33.8 g/dL (ref 31.5–35.7)
MCV: 93 fL (ref 79–97)
Monocytes Absolute: 0.9 10*3/uL (ref 0.1–0.9)
Monocytes: 9 %
Neutrophils Absolute: 5.7 10*3/uL (ref 1.4–7.0)
Neutrophils: 62 %
Platelets: 307 10*3/uL (ref 150–450)
RBC: 4.11 x10E6/uL (ref 3.77–5.28)
RDW: 12 % (ref 11.7–15.4)
WBC: 9.2 10*3/uL (ref 3.4–10.8)

## 2022-12-05 LAB — CMP14+EGFR
ALT: 37 IU/L — ABNORMAL HIGH (ref 0–32)
AST: 29 IU/L (ref 0–40)
Albumin/Globulin Ratio: 1.8 (ref 1.2–2.2)
Albumin: 4.3 g/dL (ref 3.8–4.9)
Alkaline Phosphatase: 141 IU/L — ABNORMAL HIGH (ref 44–121)
BUN/Creatinine Ratio: 11 — ABNORMAL LOW (ref 12–28)
BUN: 9 mg/dL (ref 8–27)
Bilirubin Total: 0.2 mg/dL (ref 0.0–1.2)
CO2: 25 mmol/L (ref 20–29)
Calcium: 9.7 mg/dL (ref 8.7–10.3)
Chloride: 102 mmol/L (ref 96–106)
Creatinine, Ser: 0.84 mg/dL (ref 0.57–1.00)
Globulin, Total: 2.4 g/dL (ref 1.5–4.5)
Glucose: 106 mg/dL — ABNORMAL HIGH (ref 70–99)
Potassium: 4 mmol/L (ref 3.5–5.2)
Sodium: 140 mmol/L (ref 134–144)
Total Protein: 6.7 g/dL (ref 6.0–8.5)
eGFR: 80 mL/min/{1.73_m2} (ref >59)

## 2022-12-05 LAB — THYROID PANEL WITH TSH
Free Thyroxine Index: 2.1 (ref 1.2–4.9)
T3 Uptake Ratio: 28 % (ref 24–39)
T4, Total: 7.6 ug/dL (ref 4.5–12.0)
TSH: 2.72 u[IU]/mL (ref 0.450–4.500)

## 2022-12-05 LAB — LIPID PANEL
Chol/HDL Ratio: 1.8 ratio (ref 0.0–4.4)
Cholesterol, Total: 155 mg/dL (ref 100–199)
HDL: 84 mg/dL (ref >39)
LDL Chol Calc (NIH): 56 mg/dL (ref 0–99)
Triglycerides: 83 mg/dL (ref 0–149)
VLDL Cholesterol Cal: 15 mg/dL (ref 5–40)

## 2022-12-22 DIAGNOSIS — J019 Acute sinusitis, unspecified: Secondary | ICD-10-CM | POA: Diagnosis not present

## 2022-12-22 DIAGNOSIS — R0981 Nasal congestion: Secondary | ICD-10-CM | POA: Diagnosis not present

## 2022-12-22 DIAGNOSIS — Z03818 Encounter for observation for suspected exposure to other biological agents ruled out: Secondary | ICD-10-CM | POA: Diagnosis not present

## 2022-12-26 ENCOUNTER — Encounter: Payer: Self-pay | Admitting: Nurse Practitioner

## 2022-12-26 ENCOUNTER — Ambulatory Visit: Payer: BC Managed Care – PPO | Admitting: Nurse Practitioner

## 2022-12-26 ENCOUNTER — Ambulatory Visit (INDEPENDENT_AMBULATORY_CARE_PROVIDER_SITE_OTHER): Payer: BC Managed Care – PPO

## 2022-12-26 VITALS — BP 128/77 | HR 121 | Temp 97.7°F | Resp 20 | Ht 68.0 in | Wt 103.0 lb

## 2022-12-26 DIAGNOSIS — R0989 Other specified symptoms and signs involving the circulatory and respiratory systems: Secondary | ICD-10-CM

## 2022-12-26 DIAGNOSIS — J441 Chronic obstructive pulmonary disease with (acute) exacerbation: Secondary | ICD-10-CM | POA: Diagnosis not present

## 2022-12-26 DIAGNOSIS — J439 Emphysema, unspecified: Secondary | ICD-10-CM | POA: Diagnosis not present

## 2022-12-26 MED ORDER — HYDROCODONE BIT-HOMATROP MBR 5-1.5 MG/5ML PO SOLN: 5.0000 mL | Freq: Three times a day (TID) | ORAL | 0 refills | Status: DC | PRN

## 2022-12-26 MED ORDER — FLUTICASONE-SALMETEROL 115-21 MCG/ACT IN AERO: 2.0000 | INHALATION_SPRAY | Freq: Two times a day (BID) | RESPIRATORY_TRACT | 12 refills | Status: DC

## 2022-12-26 NOTE — Addendum Note (Signed)
Addended by: Chevis Pretty on: 12/26/2022 02:26 PM   Modules accepted: Orders

## 2022-12-26 NOTE — Progress Notes (Signed)
Subjective:    Patient ID: [name], female    DOB: 1961/02/03, 62 y.o.   MRN: [id number]   Chief Complaint: Sinus Problem (Seen at Urgent care on Monday and diagnosed with sinus infection. They gave antibiotic and prednisone. )   Sinus Problem Associated symptoms include congestion and coughing. Pertinent negatives include no chills, headaches or sore throat.   Patient went to urgent care on MOnday and was dx with sinusitis. She was given prednisone and antibiotic. Prednisone made heart race so she stopped after 2 doses. She has been taking antibiotics and feels no better. Is very anxious.     Review of Systems  Constitutional:  Negative for chills and fever.  HENT:  Positive for congestion and rhinorrhea. Negative for sore throat and voice change.   Respiratory:  Positive for cough.   Genitourinary:  Positive for hematuria.  Neurological:  Negative for dizziness and headaches.       Objective:   Physical Exam Vitals reviewed.  Constitutional:      Appearance: Normal appearance.  HENT:     Right Ear: Tympanic membrane normal.     Left Ear: Tympanic membrane normal.     Nose: Congestion and rhinorrhea present.     Mouth/Throat:     Pharynx: No oropharyngeal exudate or posterior oropharyngeal erythema.  Cardiovascular:     Rate and Rhythm: Normal rate and regular rhythm.     Heart sounds: Normal heart sounds.  Pulmonary:     Effort: Pulmonary effort is normal. Respiratory distress: faint exp wheezes.     Breath sounds: Wheezing present.  Skin:    General: Skin is warm.  Neurological:     General: No focal deficit present.     Mental Status: She is alert and oriented to person, place, and time.  Psychiatric:        Mood and Affect: Mood normal.        Behavior: Behavior normal.     BP 128/77   Pulse (!) 121   Temp 97.7 F (36.5 C) (Temporal)   Resp 20   Ht '5\' 8"'$  (1.727 m)   Wt 103 lb (46.7 kg)   SpO2 93%   BMI 15.66 kg/m   Chest xray- chronic  bronchitic changes-Preliminary reading by Ronnald Collum, FNP  [facility]      Assessment & Plan:   [name] in today with chief complaint of Sinus Problem (Seen at Urgent care on Monday and diagnosed with sinus infection. They gave antibiotic and prednisone. )   1. Chest congestion - DG Chest 2 View  2. COPD with acute exacerbation ([place]) Force fluids Rest Run humidifier Mucinex during the day- hycodan at  night Finish antibiotics STOP SMOKING Meds ordered this encounter  Medications   HYDROcodone bit-homatropine (HYCODAN) 5-1.5 MG/5ML syrup    Sig: Take 5 mLs by mouth every 8 (eight) hours as needed for cough.    Dispense:  120 mL    Refill:  0    Order Specific Question:   Supervising Provider    Answer:   Caryl Pina A [[id number]]   fluticasone-salmeterol (ADVAIR HFA) 115-21 MCG/ACT inhaler    Sig: Inhale 2 puffs into the lungs 2 (two) times daily.    Dispense:  1 each    Refill:  12    Order Specific Question:   Supervising Provider    Answer:   Caryl Pina A [[id number]]       The above assessment and management plan was discussed  with the patient. The patient verbalized understanding of and has agreed to the management plan. Patient is aware to call the clinic if symptoms persist or worsen. Patient is aware when to return to the clinic for a follow-up visit. Patient educated on when it is appropriate to go to the emergency department.   Mary-Margaret Hassell Done, FNP

## 2022-12-26 NOTE — Patient Instructions (Signed)
  Acute Bronchitis, Adult  Acute bronchitis is when air tubes in the lungs (bronchi) suddenly get swollen. The condition can make it hard for you to breathe. In adults, acute bronchitis usually goes away within 2 weeks. A cough caused by bronchitis may last up to 3 weeks. Smoking, allergies, and asthma can make the condition worse. What are the causes? Germs that cause cold and flu (viruses). The most common cause of this condition is the virus that causes the common cold. Bacteria. Substances that bother (irritate) the lungs, including: Smoke from cigarettes and other types of tobacco. Dust and pollen. Fumes from chemicals, gases, or burned fuel. Indoor or outdoor air pollution. What increases the risk? A weak body's defense system. This is also called the immune system. Any condition that affects your lungs and breathing, such as asthma. What are the signs or symptoms? A cough. Coughing up clear, yellow, or green mucus. Making high-pitched whistling sounds when you breathe, most often when you breathe out (wheezing). Runny or stuffy nose. Having too much mucus in your lungs (chest congestion). Shortness of breath. Body aches. A sore throat. How is this treated? Acute bronchitis may go away over time without treatment. Your doctor may tell you to: Drink more fluids. This will help thin your mucus so it is easier to cough up. Use a device that gets medicine into your lungs (inhaler). Use a vaporizer or a humidifier. These are machines that add water to the air. This helps with coughing and poor breathing. Take a medicine that thins mucus and helps clear it from your lungs. Take a medicine that prevents or stops coughing. It is not common to take an antibiotic medicine for this condition. Follow these instructions at home:  Take over-the-counter and prescription medicines only as told by your doctor. Use an inhaler, vaporizer, or humidifier as told by your doctor. Take two  teaspoons (10 mL) of honey at bedtime. This helps lessen your coughing at night. Drink enough fluid to keep your pee (urine) pale yellow. Do not smoke or use any products that contain nicotine or tobacco. If you need help quitting, ask your doctor. Get a lot of rest. Return to your normal activities when your doctor says that it is safe. Keep all follow-up visits. How is this prevented?  Wash your hands often with soap and water for at least 20 seconds. If you cannot use soap and water, use hand sanitizer. Avoid contact with people who have cold symptoms. Try not to touch your mouth, nose, or eyes with your hands. Avoid breathing in smoke or chemical fumes. Make sure to get the flu shot every year. Contact a doctor if: Your symptoms do not get better in 2 weeks. You have trouble coughing up the mucus. Your cough keeps you awake at night. You have a fever. Get help right away if: You cough up blood. You have chest pain. You have very bad shortness of breath. You faint or keep feeling like you are going to faint. You have a very bad headache. Your fever or chills get worse. These symptoms may be an emergency. Get help right away. Call your local emergency services (911 in the [place] Do not wait to see if the symptoms will go away. Do not drive yourself to the hospital. Summary Acute bronchitis is when air tubes in the lungs (bronchi) suddenly get swollen. In adults, acute bronchitis usually goes away within 2 weeks. Drink more fluids. This will help thin your mucus so it   is easier to cough up. Take over-the-counter and prescription medicines only as told by your doctor. Contact a doctor if your symptoms do not improve after 2 weeks of treatment. This information is not intended to replace advice given to you by your health care provider. Make sure you discuss any questions you have with your health care provider. Document Revised: 03/13/2021 Document Reviewed: 03/13/2021 Elsevier  Patient Education  2023 [employer] Inc[employer]  

## 2022-12-31 ENCOUNTER — Encounter: Payer: Self-pay | Admitting: Nurse Practitioner

## 2023-01-20 ENCOUNTER — Encounter: Payer: Self-pay | Admitting: Nurse Practitioner

## 2023-01-23 ENCOUNTER — Encounter: Payer: Self-pay | Admitting: Nurse Practitioner

## 2023-02-09 ENCOUNTER — Other Ambulatory Visit: Payer: Self-pay | Admitting: Nurse Practitioner

## 2023-02-09 DIAGNOSIS — I10 Essential (primary) hypertension: Secondary | ICD-10-CM

## 2023-04-27 IMAGING — US US ABDOMEN LIMITED
1 series · 14 of 25 positions shown · non-contrast
Comparison: None.

CLINICAL DATA: Elevated LFTs

EXAM:
ULTRASOUND ABDOMEN LIMITED RIGHT UPPER QUADRANT

[Series 1: us abdomen limited · 0.12mm/px · 14 of 56 slices shown]
[im 1/56]
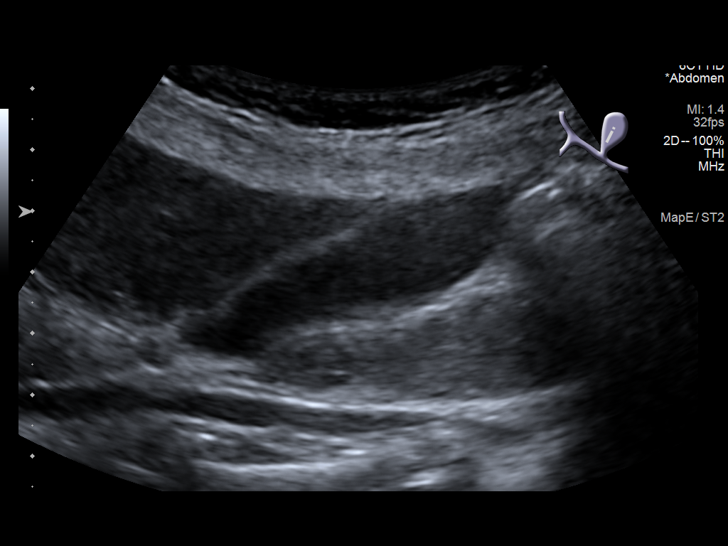
[im 5/56]
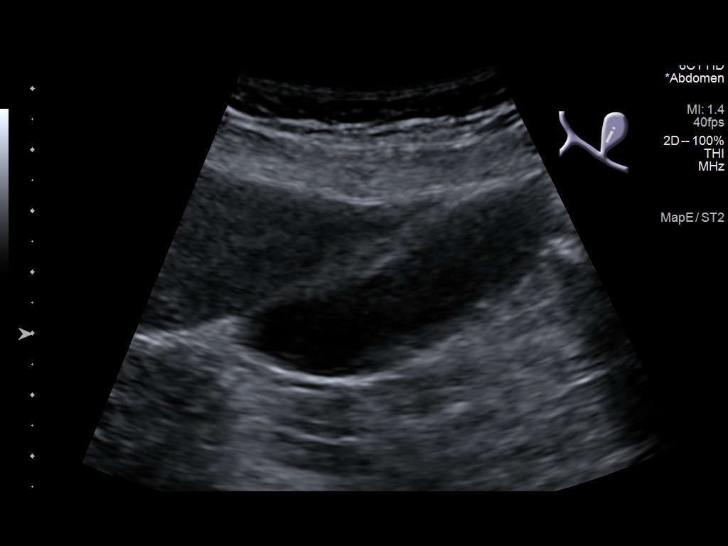
[im 10/56]
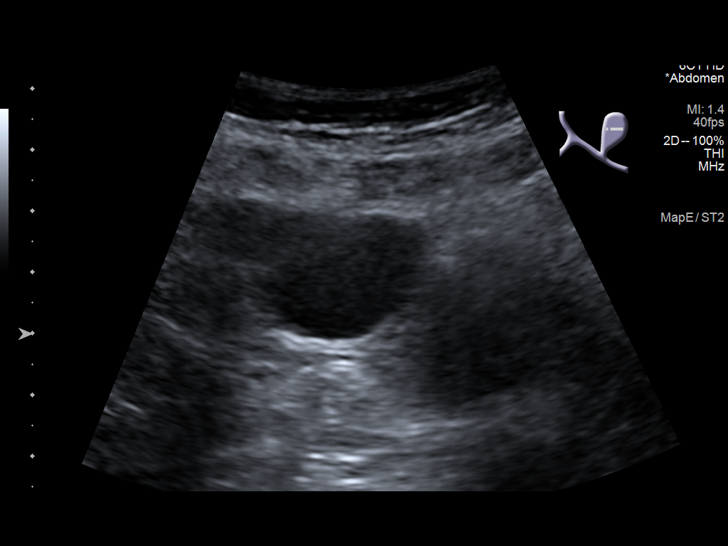
[im 14/56]
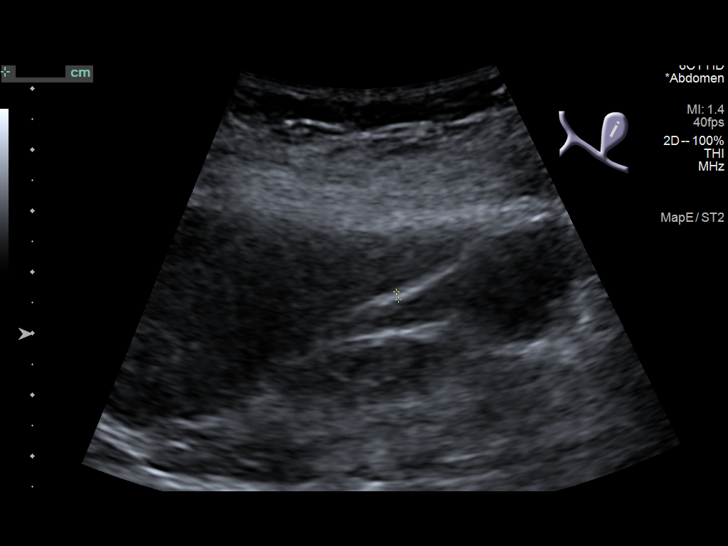
[im 19/56]
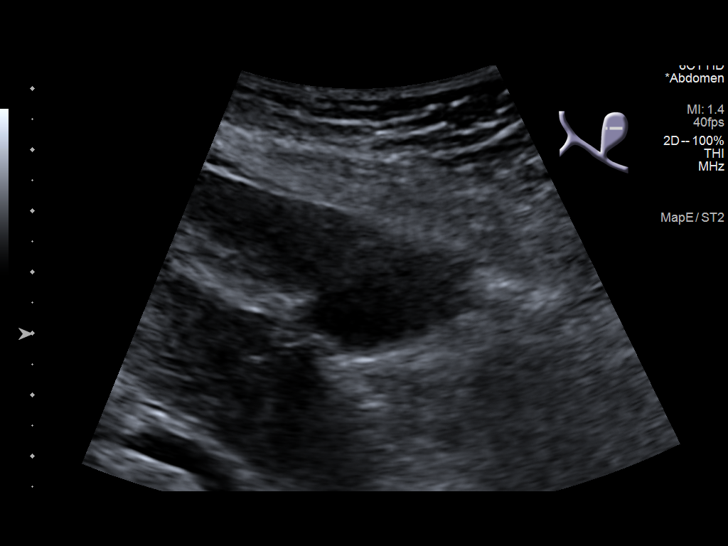
[im 21/56]
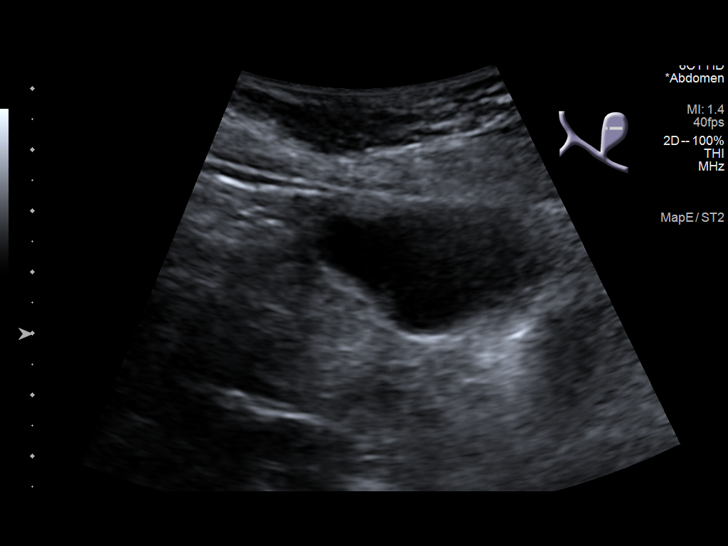
[im 26/56]
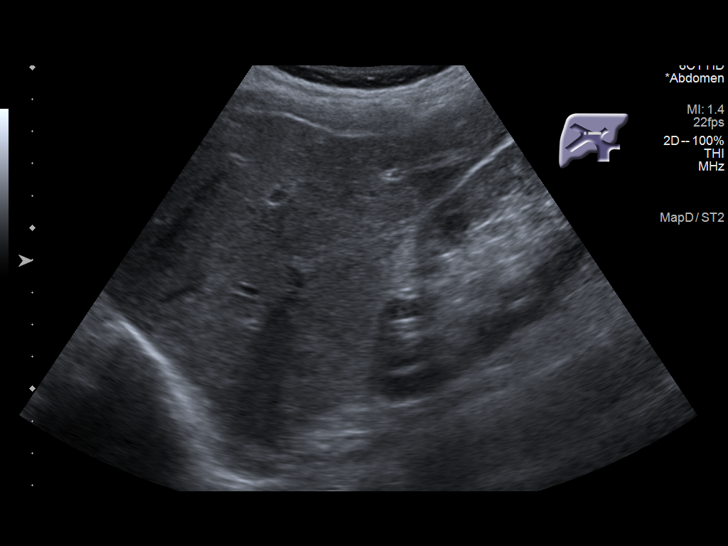
[im 30/56]
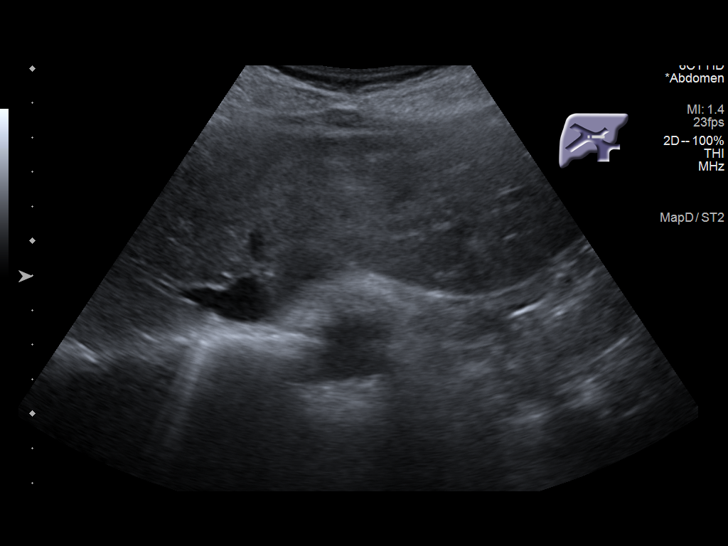
[im 35/56]
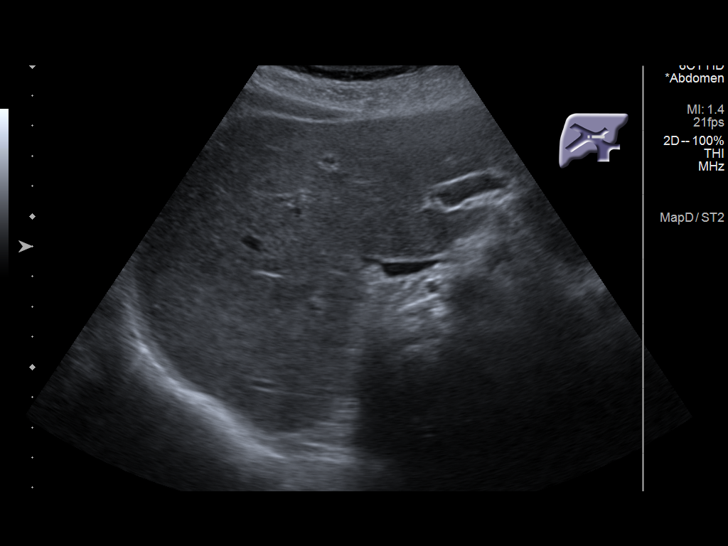
[im 37/56]
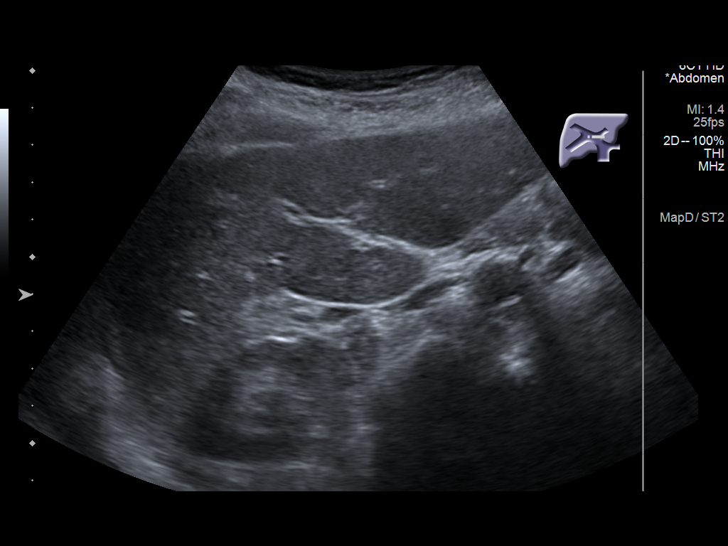
[im 42/56]
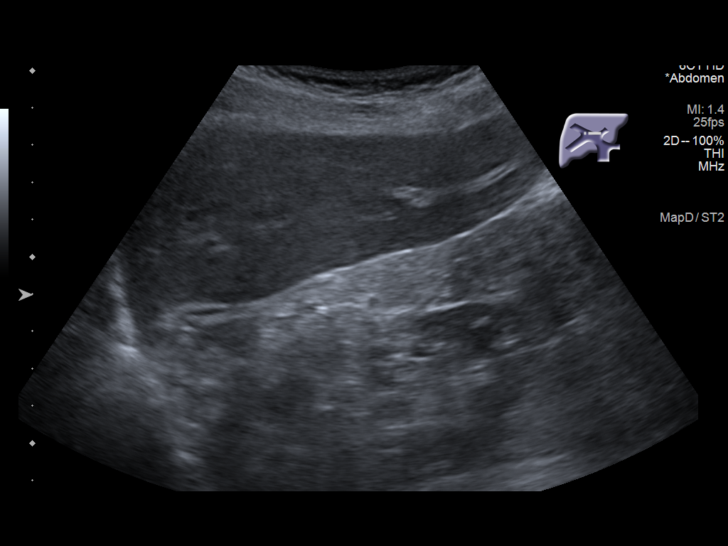
[im 46/56]
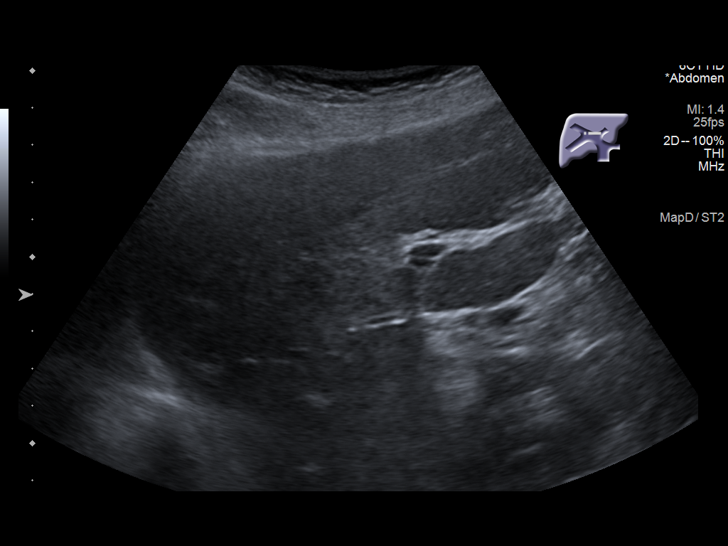
[im 51/56]
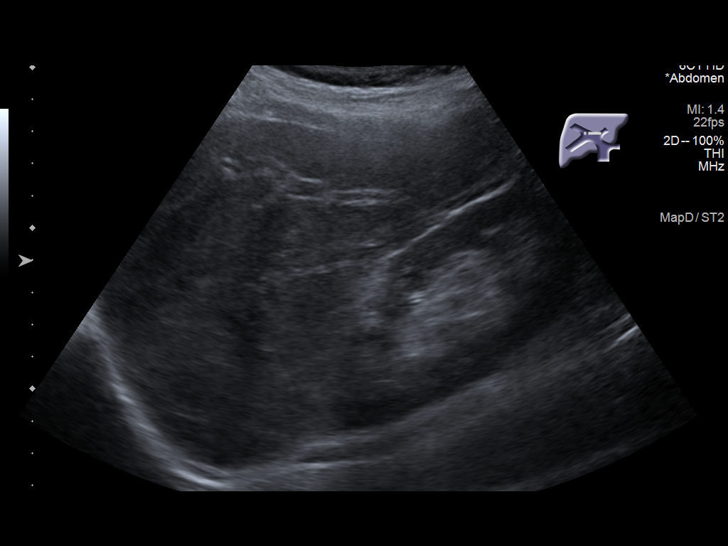
[im 56/56]
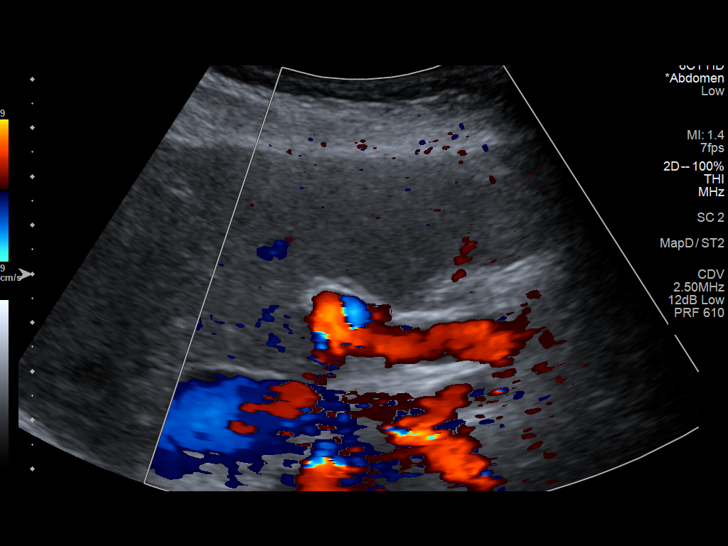

[14 of 25 positions shown; findings below may reference images not displayed]

FINDINGS: Gallbladder:

No gallstones or wall thickening visualized. No sonographic Murphy
sign noted by sonographer.

Common bile duct:

Diameter: 4.4 mm

Liver:

Mild increased echogenicity. Portal vein is patent on color Doppler
imaging with normal direction of blood flow towards the liver.

Other: None.
IMPRESSION: 1. Probable mild hepatic steatosis.
2. No other abnormalities.

## 2023-04-29 ENCOUNTER — Other Ambulatory Visit: Payer: Self-pay | Admitting: Nurse Practitioner

## 2023-04-29 DIAGNOSIS — F339 Major depressive disorder, recurrent, unspecified: Secondary | ICD-10-CM

## 2023-04-29 DIAGNOSIS — E7800 Pure hypercholesterolemia, unspecified: Secondary | ICD-10-CM

## 2023-04-29 DIAGNOSIS — I10 Essential (primary) hypertension: Secondary | ICD-10-CM

## 2023-05-23 ENCOUNTER — Other Ambulatory Visit: Payer: Self-pay | Admitting: Nurse Practitioner

## 2023-05-23 DIAGNOSIS — I10 Essential (primary) hypertension: Secondary | ICD-10-CM

## 2023-05-23 DIAGNOSIS — E039 Hypothyroidism, unspecified: Secondary | ICD-10-CM

## 2023-06-04 ENCOUNTER — Encounter: Payer: Self-pay | Admitting: Nurse Practitioner

## 2023-06-04 ENCOUNTER — Ambulatory Visit: Payer: BC Managed Care – PPO | Admitting: Nurse Practitioner

## 2023-06-04 VITALS — BP 137/87 | HR 115 | Temp 97.3°F | Resp 20 | Ht 68.0 in | Wt 101.0 lb

## 2023-06-04 DIAGNOSIS — F419 Anxiety disorder, unspecified: Secondary | ICD-10-CM

## 2023-06-04 DIAGNOSIS — R6889 Other general symptoms and signs: Secondary | ICD-10-CM | POA: Diagnosis not present

## 2023-06-04 DIAGNOSIS — E7800 Pure hypercholesterolemia, unspecified: Secondary | ICD-10-CM | POA: Diagnosis not present

## 2023-06-04 DIAGNOSIS — I10 Essential (primary) hypertension: Secondary | ICD-10-CM | POA: Diagnosis not present

## 2023-06-04 DIAGNOSIS — F339 Major depressive disorder, recurrent, unspecified: Secondary | ICD-10-CM | POA: Diagnosis not present

## 2023-06-04 DIAGNOSIS — Z171 Estrogen receptor negative status [ER-]: Secondary | ICD-10-CM

## 2023-06-04 DIAGNOSIS — E039 Hypothyroidism, unspecified: Secondary | ICD-10-CM | POA: Diagnosis not present

## 2023-06-04 DIAGNOSIS — C50011 Malignant neoplasm of nipple and areola, right female breast: Secondary | ICD-10-CM

## 2023-06-04 MED ORDER — VENLAFAXINE HCL ER 75 MG PO CP24
75.0000 mg | ORAL_CAPSULE | Freq: Every day | ORAL | 1 refills | Status: DC
Start: 2023-06-04 — End: 2023-10-06

## 2023-06-04 MED ORDER — AMLODIPINE BESYLATE 5 MG PO TABS
5.0000 mg | ORAL_TABLET | Freq: Every day | ORAL | 1 refills | Status: DC
Start: 2023-06-04 — End: 2023-11-26

## 2023-06-04 MED ORDER — LEVOTHYROXINE SODIUM 100 MCG PO TABS
100.0000 ug | ORAL_TABLET | Freq: Every day | ORAL | 1 refills | Status: DC
Start: 2023-06-04 — End: 2023-12-07

## 2023-06-04 MED ORDER — ATORVASTATIN CALCIUM 40 MG PO TABS
40.0000 mg | ORAL_TABLET | Freq: Every day | ORAL | 1 refills | Status: DC
Start: 2023-06-04 — End: 2023-10-06

## 2023-06-04 MED ORDER — LISINOPRIL 40 MG PO TABS
40.0000 mg | ORAL_TABLET | Freq: Every day | ORAL | 1 refills | Status: DC
Start: 2023-06-04 — End: 2023-10-06

## 2023-06-04 MED ORDER — BUSPIRONE HCL 15 MG PO TABS
15.0000 mg | ORAL_TABLET | Freq: Three times a day (TID) | ORAL | 1 refills | Status: DC
Start: 2023-06-04 — End: 2023-12-07

## 2023-06-04 NOTE — Progress Notes (Signed)
Subjective:    Patient ID: [name], female    DOB: August 13, 1961, 62 y.o.   MRN: [id number]   Chief Complaint: medical management of chronic issues     HPI:  [name] is a 62 y.o. who identifies as a female who was assigned female at birth.   Social history: Lives with: husband Work history: [employer]   Comes in today for follow up of the following chronic medical issues:  1. Primary hypertension No c/o chest pain, sob or headache. Does not check blood pressure at home. BP Readings from Last 3 Encounters:  12/26/22 128/77  12/04/22 131/77  06/02/22 134/81     2. Acquired hypothyroidism No issues that she is aware of. Lab Results  Component Value Date   TSH 2.720 12/04/2022     3. Depression, recurrent (HCC) Is on combination of effexor and buspar. Says she is doing well.    06/04/2023    3:14 PM 12/26/2022    1:53 PM 12/04/2022    1:48 PM  Depression screen PHQ 2/9  Decreased Interest 0 0 0  Down, Depressed, Hopeless 0 0 0  PHQ - 2 Score 0 0 0  Altered sleeping 0 0 0  Tired, decreased energy 0 0 0  Change in appetite 0 0 0  Feeling bad or failure about yourself  0 0 0  Trouble concentrating 0 0 0  Moving slowly or fidgety/restless 0 0 0  Suicidal thoughts 0 0 0  PHQ-9 Score 0 0 0  Difficult doing work/chores Not difficult at all Not difficult at all Not difficult at all     4. Malignant neoplasm of nipple of right breast in female, estrogen receptor negative (HCC) Has finished all her treatments. Has yearly follow up with oncology   New complaints: None today  Allergies  Allergen Reactions   Sulfa Antibiotics Other (See Comments)    Makes mouth raw   Codeine Nausea And Vomiting   Outpatient Encounter Medications as of 06/04/2023  Medication Sig   amLODipine (NORVASC) 5 MG tablet TAKE 1 TABLET BY MOUTH DAILY   atorvastatin (LIPITOR) 40 MG tablet TAKE 1 TABLET BY MOUTH DAILY   busPIRone (BUSPAR) 15 MG tablet Take 1 tablet (15 mg total) by  mouth 3 (three) times daily.   Calcium Carbonate-Vitamin D (CALCIUM + D PO) Take by mouth.   Cyanocobalamin (B-12 PO) Take by mouth.   fluticasone-salmeterol (ADVAIR HFA) 115-21 MCG/ACT inhaler Inhale 2 puffs into the lungs 2 (two) times daily.   HYDROcodone bit-homatropine (HYCODAN) 5-1.5 MG/5ML syrup Take 5 mLs by mouth every 8 (eight) hours as needed for cough.   levothyroxine (SYNTHROID) 100 MCG tablet TAKE 1 TABLET BY MOUTH DAILY   lisinopril (ZESTRIL) 40 MG tablet TAKE 1 TABLET BY MOUTH DAILY   loratadine (CLARITIN) 10 MG tablet Take 10 mg by mouth daily.   Multiple Vitamin (MULTIVITAMIN) capsule Take 1 capsule by mouth daily.   venlafaxine XR (EFFEXOR-XR) 75 MG 24 hr capsule TAKE 1 CAPSULE BY MOUTH DAILY   No facility-administered encounter medications on file as of 06/04/2023.    Past Surgical History:  Procedure Laterality Date   AMPUTATION Right 06/30/2020   Procedure: Revision of Right middle finger amputation;  Surgeon: Bradly Bienenstock, MD;  Location: [facility] OR;  Service: Orthopedics;  Laterality: Right;   BREAST SURGERY  04/10/2011   Right mastectomy and slnbx   LYMPH NODE BIOPSY  1996   rt groin   MULTIPLE TOOTH EXTRACTIONS  PORT-A-CATH REMOVAL  2012   insertion and out   RADIOACTIVE SEED GUIDED EXCISIONAL BREAST BIOPSY Left 01/16/2015   Procedure: RADIOACTIVE SEED GUIDED EXCISIONAL BREAST BIOPSY;  Surgeon: Ovidio Kin, MD;  Location: [facility];  Service: General;  Laterality: Left;   TUBAL LIGATION      Family History  Problem Relation Age of Onset   Stroke Mother    Hypertension Mother    Heart disease Mother    Cancer Maternal Grandmother        Breast cancer      Controlled substance contract: n/a     Review of Systems  Constitutional:  Negative for diaphoresis.  Eyes:  Negative for pain.  Respiratory:  Negative for shortness of breath.   Cardiovascular:  Negative for chest pain, palpitations and leg swelling.  Gastrointestinal:  Negative  for abdominal pain.  Endocrine: Negative for polydipsia.  Skin:  Negative for rash.  Neurological:  Negative for dizziness, weakness and headaches.  Hematological:  Does not bruise/bleed easily.  All other systems reviewed and are negative.      Objective:   Physical Exam Vitals and nursing note reviewed.  Constitutional:      General: She is not in acute distress.    Appearance: Normal appearance. She is well-developed.  HENT:     Head: Normocephalic.     Right Ear: Tympanic membrane normal.     Left Ear: Tympanic membrane normal.     Nose: Nose normal.     Mouth/Throat:     Mouth: Mucous membranes are moist.  Eyes:     Pupils: Pupils are equal, round, and reactive to light.  Neck:     Vascular: No carotid bruit or JVD.  Cardiovascular:     Rate and Rhythm: Normal rate and regular rhythm.     Heart sounds: Normal heart sounds.  Pulmonary:     Effort: Pulmonary effort is normal. No respiratory distress.     Breath sounds: Normal breath sounds. No wheezing or rales.  Chest:     Chest wall: No tenderness.  Abdominal:     General: Bowel sounds are normal. There is no distension or abdominal bruit.     Palpations: Abdomen is soft. There is no hepatomegaly, splenomegaly, mass or pulsatile mass.     Tenderness: There is no abdominal tenderness.  Musculoskeletal:        General: Normal range of motion.     Cervical back: Normal range of motion and neck supple.  Lymphadenopathy:     Cervical: No cervical adenopathy.  Skin:    General: Skin is warm and dry.  Neurological:     Mental Status: She is alert and oriented to person, place, and time.     Deep Tendon Reflexes: Reflexes are normal and symmetric.  Psychiatric:        Behavior: Behavior normal.        Thought Content: Thought content normal.        Judgment: Judgment normal.    BP 137/87   Pulse (!) 115   Temp (!) 97.3 F (36.3 C) (Temporal)   Resp 20   Ht 5\' 8"  (1.727 m)   Wt 101 lb (45.8 kg)   SpO2 97%    BMI 15.36 kg/m         Assessment & Plan:   [name] comes in today with chief complaint of Medical Management of Chronic Issues   Diagnosis and orders addressed:  1. Primary hypertension Low sodium diet -  amLODipine (NORVASC) 5 MG tablet; Take 1 tablet (5 mg total) by mouth daily.  Dispense: 90 tablet; Refill: 1 - lisinopril (ZESTRIL) 40 MG tablet; Take 1 tablet (40 mg total) by mouth daily.  Dispense: 90 tablet; Refill: 1 - CBC with Differential/Platelet - CMP14+EGFR  2. Acquired hypothyroidism Labs pending - levothyroxine (SYNTHROID) 100 MCG tablet; Take 1 tablet (100 mcg total) by mouth daily.  Dispense: 90 tablet; Refill: 1 - Thyroid Panel With TSH  3. Depression, recurrent (HCC) Stress management - venlafaxine XR (EFFEXOR-XR) 75 MG 24 hr capsule; Take 1 capsule (75 mg total) by mouth daily.  Dispense: 90 capsule; Refill: 1  4. Malignant neoplasm of nipple of right breast in female, estrogen receptor negative (HCC) Keep follow up with  oncology  5. Anxiety - busPIRone (BUSPAR) 15 MG tablet; Take 1 tablet (15 mg total) by mouth 3 (three) times daily.  Dispense: 90 tablet; Refill: 1  6. Pure hypercholesterolemia Low fat diet - atorvastatin (LIPITOR) 40 MG tablet; Take 1 tablet (40 mg total) by mouth daily.  Dispense: 90 tablet; Refill: 1 - Lipid panel   Labs pending Health Maintenance reviewed Diet and exercise encouraged  Follow up plan: 6 months   Mary-Margaret Daphine Deutscher, FNP

## 2023-06-04 NOTE — Patient Instructions (Signed)

## 2023-06-05 LAB — CMP14+EGFR
ALT: 61 IU/L — ABNORMAL HIGH (ref 0–32)
AST: 55 IU/L — ABNORMAL HIGH (ref 0–40)
Albumin: 4.5 g/dL (ref 3.9–4.9)
Alkaline Phosphatase: 118 IU/L (ref 44–121)
BUN/Creatinine Ratio: 13 (ref 12–28)
BUN: 10 mg/dL (ref 8–27)
Bilirubin Total: 0.4 mg/dL (ref 0.0–1.2)
CO2: 22 mmol/L (ref 20–29)
Calcium: 10.1 mg/dL (ref 8.7–10.3)
Chloride: 102 mmol/L (ref 96–106)
Creatinine, Ser: 0.78 mg/dL (ref 0.57–1.00)
Globulin, Total: 2.8 g/dL (ref 1.5–4.5)
Glucose: 103 mg/dL — ABNORMAL HIGH (ref 70–99)
Potassium: 4 mmol/L (ref 3.5–5.2)
Sodium: 140 mmol/L (ref 134–144)
Total Protein: 7.3 g/dL (ref 6.0–8.5)
eGFR: 86 mL/min/{1.73_m2} (ref >59)

## 2023-06-05 LAB — CBC WITH DIFFERENTIAL/PLATELET
Basophils Absolute: 0 10*3/uL (ref 0.0–0.2)
Basos: 1 %
EOS (ABSOLUTE): 0.1 10*3/uL (ref 0.0–0.4)
Eos: 1 %
Hematocrit: 39.3 % (ref 34.0–46.6)
Hemoglobin: 13.1 g/dL (ref 11.1–15.9)
Immature Grans (Abs): 0 10*3/uL (ref 0.0–0.1)
Immature Granulocytes: 0 %
Lymphocytes Absolute: 2.7 10*3/uL (ref 0.7–3.1)
Lymphs: 37 %
MCH: 30.1 pg (ref 26.6–33.0)
MCHC: 33.3 g/dL (ref 31.5–35.7)
MCV: 90 fL (ref 79–97)
Monocytes Absolute: 0.7 10*3/uL (ref 0.1–0.9)
Monocytes: 9 %
Neutrophils Absolute: 3.8 10*3/uL (ref 1.4–7.0)
Neutrophils: 52 %
Platelets: 297 10*3/uL (ref 150–450)
RBC: 4.35 x10E6/uL (ref 3.77–5.28)
RDW: 11.9 % (ref 11.7–15.4)
WBC: 7.2 10*3/uL (ref 3.4–10.8)

## 2023-06-05 LAB — LIPID PANEL
Chol/HDL Ratio: 1.7 ratio (ref 0.0–4.4)
Cholesterol, Total: 159 mg/dL (ref 100–199)
HDL: 92 mg/dL (ref >39)
LDL Chol Calc (NIH): 53 mg/dL (ref 0–99)
Triglycerides: 74 mg/dL (ref 0–149)
VLDL Cholesterol Cal: 14 mg/dL (ref 5–40)

## 2023-06-05 LAB — THYROID PANEL WITH TSH
Free Thyroxine Index: 2.6 (ref 1.2–4.9)
T3 Uptake Ratio: 30 % (ref 24–39)
T4, Total: 8.5 ug/dL (ref 4.5–12.0)
TSH: 0.703 u[IU]/mL (ref 0.450–4.500)

## 2023-06-11 ENCOUNTER — Ambulatory Visit (INDEPENDENT_AMBULATORY_CARE_PROVIDER_SITE_OTHER): Payer: BC Managed Care – PPO | Admitting: Family Medicine

## 2023-06-11 ENCOUNTER — Encounter: Payer: Self-pay | Admitting: Family Medicine

## 2023-06-11 VITALS — BP 111/76 | HR 117 | Temp 98.5°F | Ht 68.0 in | Wt 100.0 lb

## 2023-06-11 DIAGNOSIS — R000 Tachycardia, unspecified: Secondary | ICD-10-CM

## 2023-06-11 DIAGNOSIS — R519 Headache, unspecified: Secondary | ICD-10-CM | POA: Diagnosis not present

## 2023-06-11 MED ORDER — KETOROLAC TROMETHAMINE 30 MG/ML IJ SOLN
30.0000 mg | Freq: Once | INTRAMUSCULAR | Status: AC
Start: 2023-06-11 — End: 2023-06-11
  Administered 2023-06-11: 30 mg via INTRAMUSCULAR

## 2023-06-11 NOTE — Progress Notes (Signed)
Reviewed EKG in office with patient. Sinus Tachycardia with occasional PAC. Nonspecific QRS widening. Nonspecific T- abnormality. No acute abnormalities

## 2023-06-11 NOTE — Progress Notes (Signed)
Acute Office Visit  Subjective:  Patient ID: [name], female    DOB: [birth year], 62 y.o.   MRN: [id number]  Chief Complaint  Patient presents with   Dizziness   HPI Patient is in today for continued fatigue, dizziness. States that dizziness is improved from Monday night. Monday night symptoms started with dizziness and vomiting. Believed it was due to vertigo. Since Tuesday has been fatigued, has sinus pressure with headache. Denies palpitations.  Denies fever, rhinorrhea, congestion. Endorses slight cough.  Main symptom is headache. Has not taken any OTC analgesics. States she was scared because she doesn't know what is causing the headache. States that headache has been constant.   ROS As per HPI  Objective:  BP 111/76   Pulse (!) 117   Temp 98.5 F (36.9 C)   Ht 5\' 8"  (1.727 m)   Wt 100 lb (45.4 kg)   SpO2 96%   BMI 15.20 kg/m   Physical Exam Constitutional:      General: She is awake. She is not in acute distress.    Appearance: Normal appearance. She is well-developed, well-groomed and underweight. She is not ill-appearing, toxic-appearing or diaphoretic.  Eyes:     Extraocular Movements:     Right eye: Normal extraocular motion and no nystagmus.     Left eye: Normal extraocular motion and no nystagmus.  Cardiovascular:     Rate and Rhythm: Regular rhythm. Tachycardia present.     Pulses: Normal pulses.          Radial pulses are 2+ on the right side and 2+ on the left side.       Posterior tibial pulses are 2+ on the right side and 2+ on the left side.     Heart sounds: Normal heart sounds. No murmur heard.    No gallop.  Pulmonary:     Effort: Pulmonary effort is normal. No respiratory distress.     Breath sounds: Normal breath sounds. No stridor. No wheezing, rhonchi or rales.  Musculoskeletal:     Cervical back: Full passive range of motion without pain and neck supple.     Right lower leg: No edema.     Left lower leg: No edema.  Skin:    General:  Skin is warm.     Capillary Refill: Capillary refill takes less than 2 seconds.  Neurological:     General: No focal deficit present.     Mental Status: She is alert, oriented to person, place, and time and easily aroused. Mental status is at baseline.     GCS: GCS eye subscore is 4. GCS verbal subscore is 5. GCS motor subscore is 6.     Motor: No weakness.  Psychiatric:        Attention and Perception: Attention and perception normal.        Mood and Affect: Mood and affect normal.        Speech: Speech normal.        Behavior: Behavior normal. Behavior is cooperative.        Thought Content: Thought content normal. Thought content does not include homicidal or suicidal ideation. Thought content does not include homicidal or suicidal plan.        Cognition and Memory: Cognition and memory normal.        Judgment: Judgment normal.       06/11/2023    3:29 PM 06/04/2023    3:14 PM 12/26/2022    1:53 PM  Depression screen PHQ  2/9  Decreased Interest 0 0 0  Down, Depressed, Hopeless 0 0 0  PHQ - 2 Score 0 0 0  Altered sleeping 0 0 0  Tired, decreased energy 0 0 0  Change in appetite 0 0 0  Feeling bad or failure about yourself  0 0 0  Trouble concentrating 0 0 0  Moving slowly or fidgety/restless 0 0 0  Suicidal thoughts 0 0 0  PHQ-9 Score 0 0 0  Difficult doing work/chores Not difficult at all Not difficult at all Not difficult at all      06/11/2023    3:29 PM 06/04/2023    3:14 PM 12/26/2022    1:53 PM 12/04/2022    1:48 PM  GAD 7 : Generalized Anxiety Score  Nervous, Anxious, on Edge 0 0 0 0  Control/stop worrying 0 0 0 0  Worry too much - different things 0 0 0 0  Trouble relaxing 0 0 0 0  Restless 0 0 0 0  Easily annoyed or irritable 0 0 0 0  Afraid - awful might happen 0 0 0 0  Total GAD 7 Score 0 0 0 0  Anxiety Difficulty Not difficult at all Not difficult at all Not difficult at all Not difficult at all   Assessment & Plan:  1. Acute nonintractable headache,  unspecified headache type Provided ketorolac as below. Suspect migraine headache, however, patient does not have a history of migraines. If persists can trial nurtec or triptan. Instructed patient on red flags and when to return to office.  - ketorolac (TORADOL) 30 MG/ML injection 30 mg  2. Tachycardia Reviewed EKG in office. ST with occasional PAC. Nonspecific QRS widening. Nonspecific T- abnormality. Possible atrial enlargement. No acute abnormalities. Patient is asymptomatic. Follow up with PCP if continues for further evaluation.  - EKG 12-Lead  The above assessment and management plan was discussed with the patient. The patient verbalized understanding of and has agreed to the management plan using shared-decision making. Patient is aware to call the clinic if they develop any new symptoms or if symptoms fail to improve or worsen. Patient is aware when to return to the clinic for a follow-up visit. Patient educated on when it is appropriate to go to the emergency department.   Return if symptoms worsen or fail to improve.  Neale Burly, DNP-FNP [facility] [address] [place], [place] [address] [phone]

## 2023-06-29 DIAGNOSIS — M79671 Pain in right foot: Secondary | ICD-10-CM | POA: Diagnosis not present

## 2023-09-21 DIAGNOSIS — Z23 Encounter for immunization: Secondary | ICD-10-CM | POA: Diagnosis not present

## 2023-10-05 ENCOUNTER — Other Ambulatory Visit: Payer: Self-pay | Admitting: Nurse Practitioner

## 2023-10-05 DIAGNOSIS — I10 Essential (primary) hypertension: Secondary | ICD-10-CM

## 2023-10-05 DIAGNOSIS — E7800 Pure hypercholesterolemia, unspecified: Secondary | ICD-10-CM

## 2023-10-05 DIAGNOSIS — F339 Major depressive disorder, recurrent, unspecified: Secondary | ICD-10-CM

## 2023-11-11 ENCOUNTER — Telehealth: Payer: Self-pay

## 2023-11-11 NOTE — Progress Notes (Signed)
 [facility] Assistant attempted to call patient on today regarding preventative mammogram screening. No answer from patient after multiple rings. Assistant left confidential voicemail for patient to return call.  Will call back patient back for final attempt.   Baruch Gouty [place]/VBCI  [facility] Assistant-Population Health [phone]

## 2023-11-25 ENCOUNTER — Other Ambulatory Visit: Payer: Self-pay | Admitting: Nurse Practitioner

## 2023-11-25 DIAGNOSIS — I10 Essential (primary) hypertension: Secondary | ICD-10-CM

## 2023-12-07 ENCOUNTER — Ambulatory Visit: Payer: BC Managed Care – PPO | Admitting: Nurse Practitioner

## 2023-12-07 ENCOUNTER — Encounter: Payer: Self-pay | Admitting: Nurse Practitioner

## 2023-12-07 VITALS — BP 136/84 | HR 116 | Temp 97.3°F | Ht 68.0 in | Wt 97.0 lb

## 2023-12-07 DIAGNOSIS — F419 Anxiety disorder, unspecified: Secondary | ICD-10-CM

## 2023-12-07 DIAGNOSIS — F339 Major depressive disorder, recurrent, unspecified: Secondary | ICD-10-CM

## 2023-12-07 DIAGNOSIS — E7800 Pure hypercholesterolemia, unspecified: Secondary | ICD-10-CM

## 2023-12-07 DIAGNOSIS — R739 Hyperglycemia, unspecified: Secondary | ICD-10-CM | POA: Diagnosis not present

## 2023-12-07 DIAGNOSIS — E039 Hypothyroidism, unspecified: Secondary | ICD-10-CM

## 2023-12-07 DIAGNOSIS — I10 Essential (primary) hypertension: Secondary | ICD-10-CM

## 2023-12-07 LAB — LIPID PANEL

## 2023-12-07 MED ORDER — LEVOTHYROXINE SODIUM 100 MCG PO TABS
100.0000 ug | ORAL_TABLET | Freq: Every day | ORAL | 1 refills | Status: AC
Start: 2023-12-07 — End: ?

## 2023-12-07 MED ORDER — ATORVASTATIN CALCIUM 40 MG PO TABS
40.0000 mg | ORAL_TABLET | Freq: Every day | ORAL | 1 refills | Status: DC
Start: 2023-12-07 — End: 2024-07-12

## 2023-12-07 MED ORDER — VENLAFAXINE HCL ER 75 MG PO CP24
75.0000 mg | ORAL_CAPSULE | Freq: Every day | ORAL | 1 refills | Status: DC
Start: 2023-12-07 — End: 2024-07-12

## 2023-12-07 MED ORDER — AMLODIPINE BESYLATE 5 MG PO TABS
5.0000 mg | ORAL_TABLET | Freq: Every day | ORAL | 1 refills | Status: DC
Start: 2023-12-07 — End: 2024-04-27

## 2023-12-07 MED ORDER — BUSPIRONE HCL 15 MG PO TABS
15.0000 mg | ORAL_TABLET | Freq: Three times a day (TID) | ORAL | 1 refills | Status: DC
Start: 2023-12-07 — End: 2024-07-12

## 2023-12-07 MED ORDER — LISINOPRIL 40 MG PO TABS
40.0000 mg | ORAL_TABLET | Freq: Every day | ORAL | 1 refills | Status: DC
Start: 2023-12-07 — End: 2024-07-12

## 2023-12-07 NOTE — Patient Instructions (Signed)

## 2023-12-07 NOTE — Progress Notes (Signed)
 Subjective:    Patient ID: [name], female    DOB: [birth year], 62 y.o.   MRN: [id number]   Chief Complaint: medical management of chronic issues     HPI:  [name] is a 63 y.o. who identifies as a female who was assigned female at birth.   Social history: Lives with: husband Work history: [employer]   Comes in today for follow up of the following chronic medical issues:  1. Primary hypertension No c/o chest pain, sob or headache. Does not check blood pressure at home. BP Readings from Last 3 Encounters:  06/11/23 111/76  06/04/23 137/87  12/26/22 128/77     2. Acquired hypothyroidism No issues that she is aware of. Lab Results  Component Value Date   TSH 0.703 06/04/2023     3. Depression, recurrent (HCC) Is on combination of effexor  and buspar . Says she is doing well.    12/07/2023    3:57 PM 06/11/2023    3:29 PM 06/04/2023    3:14 PM  Depression screen PHQ 2/9  Decreased Interest 0 0 0  Down, Depressed, Hopeless 0 0 0  PHQ - 2 Score 0 0 0  Altered sleeping 0 0 0  Tired, decreased energy 0 0 0  Change in appetite 0 0 0  Feeling bad or failure about yourself  0 0 0  Trouble concentrating 0 0 0  Moving slowly or fidgety/restless 0 0 0  Suicidal thoughts 0 0 0  PHQ-9 Score 0 0 0  Difficult doing work/chores Not difficult at all Not difficult at all Not difficult at all      12/07/2023    3:57 PM 06/11/2023    3:29 PM 06/04/2023    3:14 PM 12/26/2022    1:53 PM  GAD 7 : Generalized Anxiety Score  Nervous, Anxious, on Edge 2 0 0 0  Control/stop worrying 1 0 0 0  Worry too much - different things 1 0 0 0  Trouble relaxing 0 0 0 0  Restless 1 0 0 0  Easily annoyed or irritable 0 0 0 0  Afraid - awful might happen 0 0 0 0  Total GAD 7 Score 5 0 0 0  Anxiety Difficulty Not difficult at all Not difficult at all Not difficult at all Not difficult at all       4. Malignant neoplasm of nipple of right breast in female, estrogen receptor negative  (HCC) Has finished all her treatments. Has yearly follow up with oncology   New complaints: None today  Allergies  Allergen Reactions   Sulfa Antibiotics Other (See Comments)    Makes mouth raw   Codeine  Nausea And Vomiting   Outpatient Encounter Medications as of 12/07/2023  Medication Sig   amLODipine  (NORVASC ) 5 MG tablet TAKE 1 TABLET BY MOUTH DAILY   atorvastatin  (LIPITOR) 40 MG tablet TAKE 1 TABLET BY MOUTH DAILY   busPIRone  (BUSPAR ) 15 MG tablet Take 1 tablet (15 mg total) by mouth 3 (three) times daily.   Calcium  Carbonate-Vitamin D  (CALCIUM  + D PO) Take by mouth.   Cyanocobalamin  (B-12 PO) Take by mouth.   fluticasone -salmeterol (ADVAIR HFA) 115-21 MCG/ACT inhaler Inhale 2 puffs into the lungs 2 (two) times daily.   levothyroxine  (SYNTHROID ) 100 MCG tablet Take 1 tablet (100 mcg total) by mouth daily.   lisinopril  (ZESTRIL ) 40 MG tablet TAKE 1 TABLET BY MOUTH DAILY   loratadine (CLARITIN) 10 MG tablet Take 10 mg by mouth daily.  Multiple Vitamin (MULTIVITAMIN) capsule Take 1 capsule by mouth daily.   venlafaxine  XR (EFFEXOR -XR) 75 MG 24 hr capsule TAKE 1 CAPSULE BY MOUTH DAILY   No facility-administered encounter medications on file as of 12/07/2023.    Past Surgical History:  Procedure Laterality Date   AMPUTATION Right 06/30/2020   Procedure: Revision of Right middle finger amputation;  Surgeon: Shari Easter, MD;  Location: [facility] OR;  Service: Orthopedics;  Laterality: Right;   BREAST SURGERY  04/10/2011   Right mastectomy and slnbx   LYMPH NODE BIOPSY  1996   rt groin   MULTIPLE TOOTH EXTRACTIONS     PORT-A-CATH REMOVAL  2012   insertion and out   RADIOACTIVE SEED GUIDED EXCISIONAL BREAST BIOPSY Left 01/16/2015   Procedure: RADIOACTIVE SEED GUIDED EXCISIONAL BREAST BIOPSY;  Surgeon: Alm Angle, MD;  Location: [facility];  Service: General;  Laterality: Left;   TUBAL LIGATION      Family History  Problem Relation Age of Onset   Stroke Mother     Hypertension Mother    Heart disease Mother    Cancer Maternal Grandmother        Breast cancer      Controlled substance contract: n/a     Review of Systems  Constitutional:  Negative for diaphoresis.  Eyes:  Negative for pain.  Respiratory:  Negative for shortness of breath.   Cardiovascular:  Negative for chest pain, palpitations and leg swelling.  Gastrointestinal:  Negative for abdominal pain.  Endocrine: Negative for polydipsia.  Skin:  Negative for rash.  Neurological:  Negative for dizziness, weakness and headaches.  Hematological:  Does not bruise/bleed easily.  All other systems reviewed and are negative.      Objective:   Physical Exam Vitals and nursing note reviewed.  Constitutional:      General: She is not in acute distress.    Appearance: Normal appearance. She is well-developed.  HENT:     Head: Normocephalic.     Right Ear: Tympanic membrane normal.     Left Ear: Tympanic membrane normal.     Nose: Nose normal.     Mouth/Throat:     Mouth: Mucous membranes are moist.  Eyes:     Pupils: Pupils are equal, round, and reactive to light.  Neck:     Vascular: No carotid bruit or JVD.  Cardiovascular:     Rate and Rhythm: Normal rate and regular rhythm.     Heart sounds: Normal heart sounds.  Pulmonary:     Effort: Pulmonary effort is normal. No respiratory distress.     Breath sounds: Normal breath sounds. No wheezing or rales.  Chest:     Chest wall: No tenderness.  Abdominal:     General: Bowel sounds are normal. There is no distension or abdominal bruit.     Palpations: Abdomen is soft. There is no hepatomegaly, splenomegaly, mass or pulsatile mass.     Tenderness: There is no abdominal tenderness.  Musculoskeletal:        General: Normal range of motion.     Cervical back: Normal range of motion and neck supple.  Lymphadenopathy:     Cervical: No cervical adenopathy.  Skin:    General: Skin is warm and dry.  Neurological:     Mental  Status: She is alert and oriented to person, place, and time.     Deep Tendon Reflexes: Reflexes are normal and symmetric.  Psychiatric:        Behavior: Behavior normal.  Thought Content: Thought content normal.        Judgment: Judgment normal.    BP 136/84   Pulse (!) 116   Temp (!) 97.3 F (36.3 C) (Temporal)   Ht 5' 8 (1.727 m)   Wt 97 lb (44 kg)   SpO2 98%   BMI 14.75 kg/m          Assessment & Plan:   [name] comes in today with chief complaint of No chief complaint on file.   Diagnosis and orders addressed:  1. Primary hypertension Low sodium diet - amLODipine  (NORVASC ) 5 MG tablet; Take 1 tablet (5 mg total) by mouth daily.  Dispense: 90 tablet; Refill: 1 - lisinopril  (ZESTRIL ) 40 MG tablet; Take 1 tablet (40 mg total) by mouth daily.  Dispense: 90 tablet; Refill: 1 - CBC with Differential/Platelet - CMP14+EGFR  2. Acquired hypothyroidism Labs pending - levothyroxine  (SYNTHROID ) 100 MCG tablet; Take 1 tablet (100 mcg total) by mouth daily.  Dispense: 90 tablet; Refill: 1 - Thyroid  Panel With TSH  3. Depression, recurrent (HCC) Stress management - venlafaxine  XR (EFFEXOR -XR) 75 MG 24 hr capsule; Take 1 capsule (75 mg total) by mouth daily.  Dispense: 90 capsule; Refill: 1  4. Malignant neoplasm of nipple of right breast in female, estrogen receptor negative (HCC) Keep follow up with  oncology  5. Anxiety - busPIRone  (BUSPAR ) 15 MG tablet; Take 1 tablet (15 mg total) by mouth 3 (three) times daily.  Dispense: 90 tablet; Refill: 1  6. Pure hypercholesterolemia Low fat diet - atorvastatin  (LIPITOR) 40 MG tablet; Take 1 tablet (40 mg total) by mouth daily.  Dispense: 90 tablet; Refill: 1 - Lipid panel   Labs pending Health Maintenance reviewed Diet and exercise encouraged  Follow up plan: 6 months   Mary-Margaret Gladis, FNP

## 2023-12-08 LAB — CMP14+EGFR
ALT: 37 IU/L — ABNORMAL HIGH (ref 0–32)
AST: 40 IU/L (ref 0–40)
Albumin: 4.3 g/dL (ref 3.9–4.9)
Alkaline Phosphatase: 142 IU/L — ABNORMAL HIGH (ref 44–121)
BUN/Creatinine Ratio: 9 — ABNORMAL LOW (ref 12–28)
BUN: 7 mg/dL — ABNORMAL LOW (ref 8–27)
CO2: 23 mmol/L (ref 20–29)
Calcium: 9.2 mg/dL (ref 8.7–10.3)
Chloride: 101 mmol/L (ref 96–106)
Creatinine, Ser: 0.76 mg/dL (ref 0.57–1.00)
Globulin, Total: 2.4 g/dL (ref 1.5–4.5)
Glucose: 116 mg/dL — ABNORMAL HIGH (ref 70–99)
Potassium: 3.6 mmol/L (ref 3.5–5.2)
Sodium: 141 mmol/L (ref 134–144)
Total Protein: 6.7 g/dL (ref 6.0–8.5)
eGFR: 89 mL/min/{1.73_m2} (ref >59)

## 2023-12-08 LAB — LIPID PANEL
Cholesterol, Total: 145 mg/dL (ref 100–199)
HDL: 95 mg/dL (ref >39)
LDL CALC COMMENT:: 1.5 ratio (ref 0.0–4.4)
LDL Chol Calc (NIH): 37 mg/dL (ref 0–99)
Triglycerides: 66 mg/dL (ref 0–149)
VLDL Cholesterol Cal: 13 mg/dL (ref 5–40)

## 2023-12-08 LAB — CBC WITH DIFFERENTIAL/PLATELET
Basophils Absolute: 0.1 10*3/uL (ref 0.0–0.2)
Basos: 1 %
EOS (ABSOLUTE): 0.1 10*3/uL (ref 0.0–0.4)
Eos: 1 %
Hematocrit: 39.6 % (ref 34.0–46.6)
Hemoglobin: 13.4 g/dL (ref 11.1–15.9)
Immature Grans (Abs): 0 10*3/uL (ref 0.0–0.1)
Immature Granulocytes: 0 %
Lymphocytes Absolute: 2.6 10*3/uL (ref 0.7–3.1)
Lymphs: 39 %
MCH: 31.4 pg (ref 26.6–33.0)
MCHC: 33.8 g/dL (ref 31.5–35.7)
MCV: 93 fL (ref 79–97)
Monocytes Absolute: 0.7 10*3/uL (ref 0.1–0.9)
Monocytes: 11 %
Neutrophils Absolute: 3.3 10*3/uL (ref 1.4–7.0)
Neutrophils: 48 %
Platelets: 312 10*3/uL (ref 150–450)
RBC: 4.27 x10E6/uL (ref 3.77–5.28)
RDW: 11.2 % — ABNORMAL LOW (ref 11.7–15.4)
WBC: 6.7 10*3/uL (ref 3.4–10.8)

## 2023-12-08 LAB — THYROID PANEL WITH TSH
Free Thyroxine Index: 2 (ref 1.2–4.9)
T3 Uptake Ratio: 28 % (ref 24–39)
T4, Total: 7.2 ug/dL (ref 4.5–12.0)
TSH: 0.875 u[IU]/mL (ref 0.450–4.500)

## 2023-12-09 LAB — SPECIMEN STATUS REPORT

## 2023-12-09 LAB — HGB A1C W/O EAG: Hgb A1c MFr Bld: 5.6 % (ref 4.8–5.6)

## 2024-03-25 DIAGNOSIS — H40033 Anatomical narrow angle, bilateral: Secondary | ICD-10-CM | POA: Diagnosis not present

## 2024-03-25 DIAGNOSIS — H5213 Myopia, bilateral: Secondary | ICD-10-CM | POA: Diagnosis not present

## 2024-04-14 ENCOUNTER — Encounter: Payer: Self-pay | Admitting: Family Medicine

## 2024-04-14 ENCOUNTER — Telehealth (INDEPENDENT_AMBULATORY_CARE_PROVIDER_SITE_OTHER): Payer: Self-pay | Admitting: Family Medicine

## 2024-04-14 DIAGNOSIS — R42 Dizziness and giddiness: Secondary | ICD-10-CM

## 2024-04-14 MED ORDER — MECLIZINE HCL 12.5 MG PO TABS: 12.5000 mg | ORAL_TABLET | Freq: Three times a day (TID) | ORAL | 0 refills | Status: DC | PRN

## 2024-04-14 NOTE — Progress Notes (Signed)
 Virtual Visit via Video   I connected with patient on 04/14/24 at 1045 by a video enabled telemedicine application and verified that I am speaking with the correct person using two identifiers.  Location patient: Home Location provider: [facility] Persons participating in the virtual visit: Patient and Provider  I discussed the limitations of evaluation and management by telemedicine and the availability of in person appointments. The patient expressed understanding and agreed to proceed.  Subjective:   HPI:  Pt presents today for  Chief Complaint  Patient presents with   Dizziness   [name] is a 63 year old female with vertigo who presents with dizziness and imbalance.  She has been experiencing dizziness and imbalance since Tuesday morning, describing it as feeling 'like you're drunk, staggering around.' This episode has persisted, causing her to miss work for the past three days.  The dizziness is exacerbated by her new job, which involves climbing in hot conditions. She has not worked for the last three days due to fear of worsening symptoms.  She describes her head as feeling like a 'bobblehead' and reports no associated confusion, syncope, or emesis. She has experienced nausea in the past with similar episodes but not during this current episode.  She has a history of vertigo and has previously used meclizine, which has been effective in managing her symptoms.       ROS per HPI  Patient Active Problem List   Diagnosis Date Noted   Primary hypertension 11/29/2020   Hypothyroidism 08/27/2020   Depression, recurrent (HCC) 08/27/2020   Breast cancer, right breast, Triple negative, CR to neoaduvant chemo, mastectomy 04/10/2011. 12/26/2011    Social History   Tobacco Use   Smoking status: Every Day    Current packs/day: 0.50    Average packs/day: 0.5 packs/day for 30.0 years (15.0 ttl pk-yrs)    Types: Cigarettes   Smokeless  tobacco: Never  Substance Use Topics   Alcohol use: Yes    Alcohol/week: 2.0 standard drinks of alcohol    Types: 2 Cans of beer per week    Comment: 1-2 beers WEEKLY    Current Outpatient Medications:    meclizine (ANTIVERT) 12.5 MG tablet, Take 1 tablet (12.5 mg total) by mouth 3 (three) times daily as needed for dizziness., Disp: 30 tablet, Rfl: 0   amLODipine  (NORVASC ) 5 MG tablet, Take 1 tablet (5 mg total) by mouth daily., Disp: 90 tablet, Rfl: 1   atorvastatin  (LIPITOR) 40 MG tablet, Take 1 tablet (40 mg total) by mouth daily., Disp: 90 tablet, Rfl: 1   busPIRone  (BUSPAR ) 15 MG tablet, Take 1 tablet (15 mg total) by mouth 3 (three) times daily., Disp: 90 tablet, Rfl: 1   Calcium  Carbonate-Vitamin D (CALCIUM  + D PO), Take by mouth., Disp: , Rfl:    Cyanocobalamin  (B-12 PO), Take by mouth., Disp: , Rfl:    fluticasone -salmeterol (ADVAIR HFA) 115-21 MCG/ACT inhaler, Inhale 2 puffs into the lungs 2 (two) times daily. (Patient not taking: Reported on 12/07/2023), Disp: 1 each, Rfl: 12   levothyroxine  (SYNTHROID ) 100 MCG tablet, Take 1 tablet (100 mcg total) by mouth daily., Disp: 90 tablet, Rfl: 1   lisinopril  (ZESTRIL ) 40 MG tablet, Take 1 tablet (40 mg total) by mouth daily., Disp: 90 tablet, Rfl: 1   loratadine (CLARITIN) 10 MG tablet, Take 10 mg by mouth daily., Disp: , Rfl:    Multiple Vitamin (MULTIVITAMIN) capsule, Take 1 capsule by mouth daily., Disp: , Rfl:  venlafaxine  XR (EFFEXOR -XR) 75 MG 24 hr capsule, Take 1 capsule (75 mg total) by mouth daily., Disp: 90 capsule, Rfl: 1  Allergies  Allergen Reactions   Sulfa Antibiotics Other (See Comments)    Makes mouth raw   Codeine  Nausea And Vomiting    Objective:   There were no vitals taken for this visit.  Patient is well-developed, well-nourished in no acute distress.  Resting comfortably at home.  Head is normocephalic, atraumatic.  No labored breathing.  Speech is clear and coherent with logical content.  Patient is  alert and oriented at baseline.   Assessment and Plan:   [name] was seen today for dizziness.  Diagnoses and all orders for this visit:  Vertigo -     meclizine (ANTIVERT) 12.5 MG tablet; Take 1 tablet (12.5 mg total) by mouth 3 (three) times daily as needed for dizziness.       Vertigo Vertigo since Tuesday morning, characterized by imbalance and a sensation of being a bobblehead. No associated nausea, vomiting, confusion, or syncope. Similar episodes in the past with relief from meclizine. Symptoms worsen with heat and physical activity. No new symptoms such as confusion, headaches, or unilateral weakness. - Prescribe meclizine and send prescription to [facility] pharmacy. - Advise caution with meclizine due to potential drowsiness; avoid operating machinery and exercise caution when walking. - Provide a work note for absence due to vertigo. - Instruct to seek immediate medical attention if new symptoms such as confusion, headaches, or unilateral weakness develop.        Return if symptoms worsen or fail to improve.  [name], FNP-C [facility] [address] [place], [place] [address] [phone]  04/14/2024  Time spent with the patient: 15 minutes, of which >50% was spent in obtaining information about symptoms, reviewing previous labs, evaluations, and treatments, counseling about condition (please see the discussed topics above), and developing a plan to further investigate it; had a number of questions which I addressed.

## 2024-04-26 ENCOUNTER — Other Ambulatory Visit: Payer: Self-pay | Admitting: Nurse Practitioner

## 2024-04-26 DIAGNOSIS — I10 Essential (primary) hypertension: Secondary | ICD-10-CM

## 2024-05-03 DIAGNOSIS — S68612A Complete traumatic transphalangeal amputation of right middle finger, initial encounter: Secondary | ICD-10-CM | POA: Diagnosis not present

## 2024-05-09 ENCOUNTER — Telehealth: Payer: Self-pay

## 2024-05-09 ENCOUNTER — Other Ambulatory Visit: Payer: Self-pay

## 2024-05-09 DIAGNOSIS — E039 Hypothyroidism, unspecified: Secondary | ICD-10-CM

## 2024-05-09 MED ORDER — LEVOTHYROXINE SODIUM 100 MCG PO TABS: 100.0000 ug | ORAL_TABLET | Freq: Every day | ORAL | 0 refills | Status: DC

## 2024-05-09 NOTE — Telephone Encounter (Signed)
 Error

## 2024-05-09 NOTE — Telephone Encounter (Signed)
 Copied from CRM [id number]. Topic: Clinical - Medication Question >> May 09, 2024  8:45 AM Lotus Round B wrote: Reason for CRM: pt called in to see if dr. Carmin Chow nurse can give her a call she said she some questions to ask regarding medications and appts . [phone]

## 2024-05-09 NOTE — Telephone Encounter (Signed)
 Duplicate telephone call.

## 2024-05-09 NOTE — Telephone Encounter (Signed)
 Called and spoke with patient. She states that she had to change jobs due to her company shutting down and her new insurance will start after August 10. Needs refill on levothyroxine  and to change her appt. Appt rescheduled for late August and sent a 90 day supply to [facility] to get her through.

## 2024-06-03 ENCOUNTER — Ambulatory Visit: Payer: BC Managed Care – PPO | Admitting: Nurse Practitioner

## 2024-06-06 ENCOUNTER — Ambulatory Visit: Admitting: Family Medicine

## 2024-06-06 ENCOUNTER — Ambulatory Visit: Payer: Self-pay | Admitting: Nurse Practitioner

## 2024-06-06 VITALS — BP 130/70 | HR 84 | Temp 99.0°F | Ht 68.0 in | Wt 90.8 lb

## 2024-06-06 DIAGNOSIS — J0140 Acute pansinusitis, unspecified: Secondary | ICD-10-CM | POA: Diagnosis not present

## 2024-06-06 MED ORDER — AMOXICILLIN-POT CLAVULANATE 875-125 MG PO TABS: 1.0000 | ORAL_TABLET | Freq: Two times a day (BID) | ORAL | 0 refills | Status: AC

## 2024-06-06 NOTE — Progress Notes (Signed)
 Acute Office Visit  Subjective:     Patient ID: [name], female    DOB: 1961-05-23, 63 y.o.   MRN: [id number]  Chief Complaint  Patient presents with   Facial Pain    Sinusitis This is a new problem. The current episode started in the past 7 days. The problem has been gradually worsening since onset. There has been no fever. Associated symptoms include congestion, coughing, ear pain, headaches (frontal, pressure, squeezing), sinus pressure and a sore throat (mild). Pertinent negatives include no chills, diaphoresis, hoarse voice, shortness of breath or sneezing. Treatments tried: alka seltzer plus. The treatment provided no relief.   Smoker. Hx of COPD. Dry cough. Denies shortness of breath or wheezing.   Review of Systems  Constitutional:  Negative for chills and diaphoresis.  HENT:  Positive for congestion, ear pain, sinus pressure and sore throat (mild). Negative for hoarse voice and sneezing.   Respiratory:  Positive for cough. Negative for shortness of breath.   Neurological:  Positive for headaches (frontal, pressure, squeezing).        Objective:    BP 130/70   Pulse 84   Temp 99 F (37.2 C) (Temporal)   Ht 5' 8 (1.727 m)   Wt 90 lb 12.8 oz (41.2 kg)   SpO2 98%   BMI 13.81 kg/m    Physical Exam Vitals and nursing note reviewed.  Constitutional:      General: She is not in acute distress.    Appearance: She is not ill-appearing, toxic-appearing or diaphoretic.  HENT:     Right Ear: Ear canal and external ear normal. A middle ear effusion is present. Tympanic membrane is not perforated, erythematous, retracted or bulging.     Left Ear: Ear canal and external ear normal. A middle ear effusion is present. Tympanic membrane is not perforated, erythematous, retracted or bulging.     Nose: Congestion present.     Right Sinus: Maxillary sinus tenderness and frontal sinus tenderness present.     Left Sinus: Maxillary sinus tenderness and frontal sinus  tenderness present.     Mouth/Throat:     Lips: Pink. No lesions.     Mouth: Mucous membranes are moist.     Pharynx: Oropharynx is clear. No pharyngeal swelling, oropharyngeal exudate, posterior oropharyngeal erythema, uvula swelling or postnasal drip.     Tonsils: No tonsillar exudate or tonsillar abscesses. 0 on the right. 0 on the left.  Eyes:     General:        Right eye: No discharge.        Left eye: No discharge.     Conjunctiva/sclera: Conjunctivae normal.  Cardiovascular:     Rate and Rhythm: Normal rate and regular rhythm.     Heart sounds: Normal heart sounds. No murmur heard. Pulmonary:     Effort: Pulmonary effort is normal. No respiratory distress.     Breath sounds: Normal breath sounds. No wheezing, rhonchi or rales.  Musculoskeletal:     Cervical back: Neck supple. No rigidity.  Lymphadenopathy:     Cervical: No cervical adenopathy.  Skin:    General: Skin is warm and dry.  Neurological:     General: No focal deficit present.     Mental Status: She is alert and oriented to person, place, and time.  Psychiatric:        Mood and Affect: Mood normal.        Behavior: Behavior normal.     No results found for any  visits on 06/06/24.      Assessment & Plan:   [name] was seen today for facial pain.  Diagnoses and all orders for this visit:  Acute non-recurrent pansinusitis Augmentin  as below. Discussed symptomatic care and return precautions. Work note provided.  -     amoxicillin -clavulanate (AUGMENTIN ) 875-125 MG tablet; Take 1 tablet by mouth 2 (two) times daily for 7 days.   Return to office for new or worsening symptoms, or if symptoms persist.   The patient indicates understanding of these issues and agrees with the plan.  [name] CHRISTELLA Search, FNP

## 2024-06-06 NOTE — Telephone Encounter (Signed)
 FYI Only or Action Required?: FYI only for provider.  Patient was last seen in primary care on 04/14/2024 by Severa Rock HERO, FNP.  Called Nurse Triage reporting Sinusitis.  Symptoms began several days ago.  Symptoms are: unchanged.  Triage Disposition: See PCP When Office is Open (Within 3 Days)  Patient/caregiver understands and will follow disposition?: Yes                         Copied from CRM [id number]. Topic: Clinical - Red Word Triage >> Jun 06, 2024  8:23 AM Miquel SAILOR wrote: Red Word that prompted transfer to Nurse Triage: Sinus headache/can not clear thoat. for 1 week but last 1-2 days been getting worse Reason for Disposition  Lots of coughing  Answer Assessment - Initial Assessment Questions LOCATION: Where does it hurt?      Head pain, feels like someone is squeezing it ONSET: When did the sinus pain start?  (e.g., hours, days)      Saturday SEVERITY: How bad is the pain?   (Scale 0-10; or none, mild, moderate or severe)     4-5/10 pain level  NASAL DISCHARGE: Do you have discharge from your nose? If so ask, What color?     Clear FEVER: Do you have a fever? If Yes, ask: What is it, how was it measured, and when did it start?      No OTHER SYMPTOMS: Do you have any other symptoms? (e.g., sore throat, cough, earache, difficulty breathing)     Sore throat, dry cough  Protocols used: Sinus Pain or Congestion-A-AH

## 2024-07-11 NOTE — Progress Notes (Unsigned)
 Subjective:    Patient ID: [name], female    DOB: Mar 03, 1961, 63 y.o.   MRN: [id number]   Chief Complaint: medical management of chronic issues     HPI:  [name] is a 63 y.o. who identifies as a female who was assigned female at birth.   Social history: Lives with: husband Work history: [employer]   Comes in today for follow up of the following chronic medical issues:  1. Primary hypertension No c/o chest pain, sob or headache. Does not check blood pressure at home. BP Readings from Last 3 Encounters:  06/06/24 130/70  12/07/23 136/84  06/11/23 111/76     2. Acquired hypothyroidism No issues that she is aware of. Lab Results  Component Value Date   TSH 0.875 12/07/2023     3. Depression, recurrent (HCC) Is on combination of effexor  and buspar . Says she is doing well.    06/06/2024   11:58 AM 12/07/2023    3:57 PM 06/11/2023    3:29 PM  Depression screen PHQ 2/9  Decreased Interest 0 0 0  Down, Depressed, Hopeless 0 0 0  PHQ - 2 Score 0 0 0  Altered sleeping 0 0 0  Tired, decreased energy 1 0 0  Change in appetite 0 0 0  Feeling bad or failure about yourself  0 0 0  Trouble concentrating 0 0 0  Moving slowly or fidgety/restless 0 0 0  Suicidal thoughts 0 0 0  PHQ-9 Score 1 0 0  Difficult doing work/chores Somewhat difficult Not difficult at all Not difficult at all      06/06/2024   11:59 AM 12/07/2023    3:57 PM 06/11/2023    3:29 PM 06/04/2023    3:14 PM  GAD 7 : Generalized Anxiety Score  Nervous, Anxious, on Edge 0 2 0 0  Control/stop worrying 0 1 0 0  Worry too much - different things 0 1 0 0  Trouble relaxing 0 0 0 0  Restless 0 1 0 0  Easily annoyed or irritable 0 0 0 0  Afraid - awful might happen 0 0 0 0  Total GAD 7 Score 0 5 0 0  Anxiety Difficulty Not difficult at all Not difficult at all Not difficult at all Not difficult at all       4. Malignant neoplasm of nipple of right breast in female, estrogen receptor negative  (HCC) Has finished all her treatments. Has yearly follow up with oncology   New complaints: None today  Allergies  Allergen Reactions   Sulfa Antibiotics Other (See Comments)    Makes mouth raw   Codeine  Nausea And Vomiting   Outpatient Encounter Medications as of 07/12/2024  Medication Sig   amLODipine  (NORVASC ) 5 MG tablet TAKE 1 TABLET BY MOUTH DAILY   atorvastatin  (LIPITOR) 40 MG tablet Take 1 tablet (40 mg total) by mouth daily.   busPIRone  (BUSPAR ) 15 MG tablet Take 1 tablet (15 mg total) by mouth 3 (three) times daily.   Calcium  Carbonate-Vitamin D  (CALCIUM  + D PO) Take by mouth.   Cyanocobalamin  (B-12 PO) Take by mouth.   levothyroxine  (SYNTHROID ) 100 MCG tablet Take 1 tablet (100 mcg total) by mouth daily.   lisinopril  (ZESTRIL ) 40 MG tablet Take 1 tablet (40 mg total) by mouth daily.   loratadine (CLARITIN) 10 MG tablet Take 10 mg by mouth daily.   Multiple Vitamin (MULTIVITAMIN) capsule Take 1 capsule by mouth daily.   venlafaxine  XR (EFFEXOR -XR) 75  MG 24 hr capsule Take 1 capsule (75 mg total) by mouth daily.   No facility-administered encounter medications on file as of 07/12/2024.    Past Surgical History:  Procedure Laterality Date   AMPUTATION Right 06/30/2020   Procedure: Revision of Right middle finger amputation;  Surgeon: Shari Easter, MD;  Location: [facility] OR;  Service: Orthopedics;  Laterality: Right;   BREAST SURGERY  04/10/2011   Right mastectomy and slnbx   LYMPH NODE BIOPSY  1996   rt groin   MULTIPLE TOOTH EXTRACTIONS     PORT-A-CATH REMOVAL  2012   insertion and out   RADIOACTIVE SEED GUIDED EXCISIONAL BREAST BIOPSY Left 01/16/2015   Procedure: RADIOACTIVE SEED GUIDED EXCISIONAL BREAST BIOPSY;  Surgeon: Alm Angle, MD;  Location: [facility];  Service: General;  Laterality: Left;   TUBAL LIGATION      Family History  Problem Relation Age of Onset   Stroke Mother    Hypertension Mother    Heart disease Mother    Cancer Maternal  Grandmother        Breast cancer      Controlled substance contract: n/a     Review of Systems  Constitutional:  Negative for diaphoresis.  Eyes:  Negative for pain.  Respiratory:  Negative for shortness of breath.   Cardiovascular:  Negative for chest pain, palpitations and leg swelling.  Gastrointestinal:  Negative for abdominal pain.  Endocrine: Negative for polydipsia.  Skin:  Negative for rash.  Neurological:  Negative for dizziness, weakness and headaches.  Hematological:  Does not bruise/bleed easily.  All other systems reviewed and are negative.      Objective:   Physical Exam Vitals and nursing note reviewed.  Constitutional:      General: She is not in acute distress.    Appearance: Normal appearance. She is well-developed.  HENT:     Head: Normocephalic.     Right Ear: Tympanic membrane normal.     Left Ear: Tympanic membrane normal.     Nose: Nose normal.     Mouth/Throat:     Mouth: Mucous membranes are moist.  Eyes:     Pupils: Pupils are equal, round, and reactive to light.  Neck:     Vascular: No carotid bruit or JVD.  Cardiovascular:     Rate and Rhythm: Normal rate and regular rhythm.     Heart sounds: Normal heart sounds.  Pulmonary:     Effort: Pulmonary effort is normal. No respiratory distress.     Breath sounds: Normal breath sounds. No wheezing or rales.  Chest:     Chest wall: No tenderness.  Abdominal:     General: Bowel sounds are normal. There is no distension or abdominal bruit.     Palpations: Abdomen is soft. There is no hepatomegaly, splenomegaly, mass or pulsatile mass.     Tenderness: There is no abdominal tenderness.  Musculoskeletal:        General: Normal range of motion.     Cervical back: Normal range of motion and neck supple.  Lymphadenopathy:     Cervical: No cervical adenopathy.  Skin:    General: Skin is warm and dry.  Neurological:     Mental Status: She is alert and oriented to person, place, and time.      Deep Tendon Reflexes: Reflexes are normal and symmetric.  Psychiatric:        Behavior: Behavior normal.        Thought Content: Thought content normal.  Judgment: Judgment normal.    There were no vitals taken for this visit.         Assessment & Plan:   [name] comes in today with chief complaint of No chief complaint on file.   Diagnosis and orders addressed:  1. Primary hypertension Low sodium diet - amLODipine  (NORVASC ) 5 MG tablet; Take 1 tablet (5 mg total) by mouth daily.  Dispense: 90 tablet; Refill: 1 - lisinopril  (ZESTRIL ) 40 MG tablet; Take 1 tablet (40 mg total) by mouth daily.  Dispense: 90 tablet; Refill: 1 - CBC with Differential/Platelet - CMP14+EGFR  2. Acquired hypothyroidism Labs pending - levothyroxine  (SYNTHROID ) 100 MCG tablet; Take 1 tablet (100 mcg total) by mouth daily.  Dispense: 90 tablet; Refill: 1 - Thyroid  Panel With TSH  3. Depression, recurrent (HCC) Stress management - venlafaxine  XR (EFFEXOR -XR) 75 MG 24 hr capsule; Take 1 capsule (75 mg total) by mouth daily.  Dispense: 90 capsule; Refill: 1  4. Malignant neoplasm of nipple of right breast in female, estrogen receptor negative (HCC) Keep follow up with  oncology  5. Anxiety - busPIRone  (BUSPAR ) 15 MG tablet; Take 1 tablet (15 mg total) by mouth 3 (three) times daily.  Dispense: 90 tablet; Refill: 1  6. Pure hypercholesterolemia Low fat diet - atorvastatin  (LIPITOR) 40 MG tablet; Take 1 tablet (40 mg total) by mouth daily.  Dispense: 90 tablet; Refill: 1 - Lipid panel   Labs pending Health Maintenance reviewed Diet and exercise encouraged  Follow up plan: 6 months   Mary-Margaret Gladis, FNP

## 2024-07-12 ENCOUNTER — Ambulatory Visit: Payer: Self-pay | Admitting: Nurse Practitioner

## 2024-07-12 ENCOUNTER — Encounter: Payer: Self-pay | Admitting: Nurse Practitioner

## 2024-07-12 VITALS — BP 134/77 | HR 78 | Temp 96.9°F | Ht 68.0 in | Wt 94.0 lb

## 2024-07-12 DIAGNOSIS — E039 Hypothyroidism, unspecified: Secondary | ICD-10-CM | POA: Diagnosis not present

## 2024-07-12 DIAGNOSIS — C50011 Malignant neoplasm of nipple and areola, right female breast: Secondary | ICD-10-CM

## 2024-07-12 DIAGNOSIS — Z0001 Encounter for general adult medical examination with abnormal findings: Secondary | ICD-10-CM | POA: Diagnosis not present

## 2024-07-12 DIAGNOSIS — M5431 Sciatica, right side: Secondary | ICD-10-CM | POA: Diagnosis not present

## 2024-07-12 DIAGNOSIS — F419 Anxiety disorder, unspecified: Secondary | ICD-10-CM

## 2024-07-12 DIAGNOSIS — E7800 Pure hypercholesterolemia, unspecified: Secondary | ICD-10-CM

## 2024-07-12 DIAGNOSIS — I10 Essential (primary) hypertension: Secondary | ICD-10-CM

## 2024-07-12 DIAGNOSIS — R200 Anesthesia of skin: Secondary | ICD-10-CM | POA: Diagnosis not present

## 2024-07-12 DIAGNOSIS — Z171 Estrogen receptor negative status [ER-]: Secondary | ICD-10-CM | POA: Diagnosis not present

## 2024-07-12 DIAGNOSIS — Z0000 Encounter for general adult medical examination without abnormal findings: Secondary | ICD-10-CM | POA: Diagnosis not present

## 2024-07-12 DIAGNOSIS — F339 Major depressive disorder, recurrent, unspecified: Secondary | ICD-10-CM

## 2024-07-12 MED ORDER — LEVOTHYROXINE SODIUM 100 MCG PO TABS: 100.0000 ug | ORAL_TABLET | Freq: Every day | ORAL | 1 refills | Status: AC

## 2024-07-12 MED ORDER — ATORVASTATIN CALCIUM 40 MG PO TABS: 40.0000 mg | ORAL_TABLET | Freq: Every day | ORAL | 1 refills | Status: AC

## 2024-07-12 MED ORDER — AMLODIPINE BESYLATE 5 MG PO TABS: 5.0000 mg | ORAL_TABLET | Freq: Every day | ORAL | 1 refills | Status: AC

## 2024-07-12 MED ORDER — LISINOPRIL 40 MG PO TABS
40.0000 mg | ORAL_TABLET | Freq: Every day | ORAL | 1 refills | Status: AC
Start: 2024-07-12 — End: ?

## 2024-07-12 MED ORDER — VENLAFAXINE HCL ER 75 MG PO CP24: 75.0000 mg | ORAL_CAPSULE | Freq: Every day | ORAL | 1 refills | Status: AC

## 2024-07-12 MED ORDER — BUSPIRONE HCL 15 MG PO TABS: 15.0000 mg | ORAL_TABLET | Freq: Three times a day (TID) | ORAL | 1 refills | Status: DC

## 2024-07-12 MED ORDER — PREDNISONE 10 MG (21) PO TBPK: ORAL_TABLET | ORAL | 0 refills | Status: DC

## 2024-07-12 NOTE — Addendum Note (Signed)
 Addended by: GLADIS MUSTARD on: 07/12/2024 09:29 AM   Modules accepted: Orders

## 2024-07-12 NOTE — Patient Instructions (Signed)
 Sciatica  Sciatica is pain, weakness, tingling, or loss of feeling (numbness) along the sciatic nerve. The sciatic nerve starts in the lower back and goes down the back of each leg. Sciatica usually affects one side of the body. Sciatica usually goes away on its own or with treatment. Sometimes, sciatica may come back. What are the causes? This condition happens when the sciatic nerve is pinched or has pressure put on it. This may be caused by: A disk in between the bones of the spine bulging out too far (herniated disk). Changes in the spinal disks due to aging. A condition that affects a muscle in the butt. Extra bone growth near the sciatic nerve. A break (fracture) of the area between your hip bones (pelvis). Pregnancy. Tumor. This is rare. What increases the risk? You are more likely to develop this condition if you: Play sports that put pressure or stress on the spine. Have poor strength and ease of movement (flexibility). Have had a back injury or back surgery. Sit for long periods of time. Do activities that involve bending or lifting over and over again. Are very overweight (obese). What are the signs or symptoms? Symptoms can vary from mild to very bad. They may include: Any of these problems in the lower back, leg, hip, or butt: Mild tingling, loss of feeling, or dull aches. A burning feeling. Sharp pains. Loss of feeling in the back of the calf or the sole of the foot. Leg weakness. Very bad back pain that makes it hard to move. These symptoms may get worse when you cough, sneeze, or laugh. They may also get worse when you sit or stand for long periods of time. How is this treated? This condition often gets better without any treatment. However, treatment may include: Changing or cutting back on physical activity when you have pain. Exercising, including strengthening and stretching. Putting ice or heat on the affected area. Shots of medicines to relieve pain and  swelling or to relax your muscles. Surgery. Follow these instructions at home: Medicines Take over-the-counter and prescription medicines only as told by your doctor. Ask your doctor if you should avoid driving or using machines while you are taking your medicine. Managing pain     If told, put ice on the affected area. To do this: Put ice in a plastic bag. Place a towel between your skin and the bag. Leave the ice on for 20 minutes, 2-3 times a day. If your skin turns bright red, take off the ice right away to prevent skin damage. The risk of skin damage is higher if you cannot feel pain, heat, or cold. If told, put heat on the affected area. Do this as often as told by your doctor. Use the heat source that your doctor tells you to use, such as a moist heat pack or a heating pad. Place a towel between your skin and the heat source. Leave the heat on for 20-30 minutes. If your skin turns bright red, take off the heat right away to prevent burns. The risk of burns is higher if you cannot feel pain, heat, or cold. Activity  Return to your normal activities when your doctor says that it is safe. Avoid activities that make your symptoms worse. Take short rests during the day. When you rest for a long time, do some physical activity or stretching between periods of rest. Avoid sitting for a long time without moving. Get up and move around at least one time each  hour. Do exercises and stretches as told by your doctor. Do not lift anything that is heavier than 10 lb (4.5 kg). Avoid lifting heavy things even when you do not have symptoms. Avoid lifting heavy things over and over. When you lift objects, always lift in a way that is safe for your body. To do this, you should: Bend your knees. Keep the object close to your body. Avoid twisting. General instructions Stay at a healthy weight. Wear comfortable shoes that support your feet. Avoid wearing high heels. Avoid sleeping on a mattress  that is too soft or too hard. You might have less pain if you sleep on a mattress that is firm enough to support your back. Contact a doctor if: Your pain is not controlled by medicine. Your pain does not get better. Your pain gets worse. Your pain lasts longer than 4 weeks. You lose weight without trying. Get help right away if: You cannot control when you pee (urinate) or poop (have a bowel movement). You have weakness in any of these areas and it gets worse: Lower back. The area between your hip bones. Butt. Legs. You have redness or swelling of your back. You have a burning feeling when you pee. Summary Sciatica is pain, weakness, tingling, or loss of feeling (numbness) along the sciatic nerve. This may include the lower back, legs, hips, and butt. This condition happens when the sciatic nerve is pinched or has pressure put on it. Treatment often includes rest, exercise, medicines, and putting ice or heat on the affected area. This information is not intended to replace advice given to you by your health care provider. Make sure you discuss any questions you have with your health care provider. Document Revised: 02/17/2022 Document Reviewed: 02/17/2022 Elsevier Patient Education  2024 [employer].

## 2024-07-13 ENCOUNTER — Ambulatory Visit: Payer: Self-pay | Admitting: Nurse Practitioner

## 2024-07-13 LAB — THYROID PANEL WITH TSH
Free Thyroxine Index: 2.4 (ref 1.2–4.9)
T3 Uptake Ratio: 28 (ref 24–39)
T4, Total: 8.5 ug/dL (ref 4.5–12.0)
TSH: 0.764 u[IU]/mL (ref 0.450–4.500)

## 2024-07-13 LAB — LIPID PANEL
Chol/HDL Ratio: 1.7 ratio (ref 0.0–4.4)
Cholesterol, Total: 172 mg/dL (ref 100–199)
HDL: 101 mg/dL (ref >39)
LDL Chol Calc (NIH): 57 mg/dL (ref 0–99)
Triglycerides: 74 mg/dL (ref 0–149)
VLDL Cholesterol Cal: 14 mg/dL (ref 5–40)

## 2024-07-13 LAB — CMP14+EGFR
ALT: 46 IU/L — ABNORMAL HIGH (ref 0–32)
AST: 44 IU/L — ABNORMAL HIGH (ref 0–40)
Albumin: 4.3 g/dL (ref 3.9–4.9)
Alkaline Phosphatase: 130 IU/L — ABNORMAL HIGH (ref 44–121)
BUN/Creatinine Ratio: 14 (ref 12–28)
BUN: 10 mg/dL (ref 8–27)
Bilirubin Total: 0.4 mg/dL (ref 0.0–1.2)
CO2: 24 mmol/L (ref 20–29)
Calcium: 9.4 mg/dL (ref 8.7–10.3)
Chloride: 102 mmol/L (ref 96–106)
Creatinine, Ser: 0.69 mg/dL (ref 0.57–1.00)
Globulin, Total: 2.4 g/dL (ref 1.5–4.5)
Glucose: 93 mg/dL (ref 70–99)
Potassium: 4.6 mmol/L (ref 3.5–5.2)
Sodium: 142 mmol/L (ref 134–144)
Total Protein: 6.7 g/dL (ref 6.0–8.5)
eGFR: 98 mL/min/1.73 (ref >59)

## 2024-07-13 LAB — CBC WITH DIFFERENTIAL/PLATELET
Basophils Absolute: 0 x10E3/uL (ref 0.0–0.2)
Basos: 1 %
EOS (ABSOLUTE): 0.1 x10E3/uL (ref 0.0–0.4)
Eos: 1 %
Hematocrit: 41.8 % (ref 34.0–46.6)
Hemoglobin: 13.7 g/dL (ref 11.1–15.9)
Immature Grans (Abs): 0 x10E3/uL (ref 0.0–0.1)
Immature Granulocytes: 0 %
Lymphocytes Absolute: 2.1 x10E3/uL (ref 0.7–3.1)
Lymphs: 34 %
MCH: 31.5 pg (ref 26.6–33.0)
MCHC: 32.8 g/dL (ref 31.5–35.7)
MCV: 96 fL (ref 79–97)
Monocytes Absolute: 0.7 x10E3/uL (ref 0.1–0.9)
Monocytes: 12 %
Neutrophils Absolute: 3.1 x10E3/uL (ref 1.4–7.0)
Neutrophils: 52 %
Platelets: 331 x10E3/uL (ref 150–450)
RBC: 4.35 x10E6/uL (ref 3.77–5.28)
RDW: 12 % (ref 11.7–15.4)
WBC: 6 x10E3/uL (ref 3.4–10.8)

## 2024-07-13 LAB — VITAMIN D 25 HYDROXY (VIT D DEFICIENCY, FRACTURES): Vit D, 25-Hydroxy: 44.9 ng/mL (ref 30.0–100.0)

## 2024-07-18 ENCOUNTER — Telehealth: Payer: Self-pay | Admitting: Family Medicine

## 2024-07-18 ENCOUNTER — Ambulatory Visit: Payer: Self-pay

## 2024-07-18 DIAGNOSIS — M5431 Sciatica, right side: Secondary | ICD-10-CM

## 2024-07-18 MED ORDER — CYCLOBENZAPRINE HCL 5 MG PO TABS: 5.0000 mg | ORAL_TABLET | Freq: Three times a day (TID) | ORAL | 0 refills | Status: AC | PRN

## 2024-07-18 NOTE — Telephone Encounter (Signed)
 Copied from CRM [id number]. Topic: Clinical - Prescription Issue >> Jul 18, 2024  7:57 AM Rosaria BRAVO wrote: Reason for CRM: Pt wants to speak to [facility] regarding a prescription issue. Says the medication she was given last week is not working. Seeking appt today, has not been able to work.    Best contact: [phone]   ----------------------------------------------------------------------- From previous Reason for Contact - Scheduling: Patient/patient representative is calling to schedule an appointment. Refer to attachments for appointment information. >> Jul 18, 2024 11:54 AM Emylou G wrote: Adv patient.. still waiting to hear back on her message.SABRA

## 2024-07-18 NOTE — Telephone Encounter (Signed)
 Patient is still having back pain with no relief from the prednisone . She feels like it has got worse since she took the medicine. STAT referral placed to ortho and work note left up front for patient stating to be out of work until she sees ortho. Also wants muscle relaxer sent to [facility].

## 2024-07-18 NOTE — Telephone Encounter (Signed)
 Please see other encounter.

## 2024-07-18 NOTE — Telephone Encounter (Signed)
 See other telephone encounter.

## 2024-07-18 NOTE — Telephone Encounter (Signed)
 FYI Only or Action Required?: FYI only for provider.  Patient was last seen in primary care on 07/12/2024 by Gladis Mustard, FNP.  Called Nurse Triage reporting Back Pain.  Symptoms began several weeks ago.  Interventions attempted: Nothing.  Symptoms are: gradually worsening.  Triage Disposition: See PCP When Office is Open (Within 3 Days)  Patient/caregiver understands and will follow disposition?: Yes             Copied from CRM #[id number]. Topic: Clinical - Red Word Triage >> Jul 18, 2024  2:19 PM [name] wrote: Red Word that prompted transfer to Nurse Triage: Severe back pain getting worse.   ----------------------------------------------------------------------- From previous Reason for Contact - Scheduling: Patient/patient representative is calling to schedule an appointment. Refer to attachments for appointment information. Reason for Disposition  [1] MODERATE back pain (e.g., interferes with normal activities) AND [2] present > 3 days  Answer Assessment - Initial Assessment Questions 1. ONSET: When did the pain begin? (e.g., minutes, hours, days)     It's been awhile was seen last Thursday 2. LOCATION: Where does it hurt? (upper, mid or lower back)     Lower back 3. SEVERITY: How bad is the pain?  (e.g., Scale 1-10; mild, moderate, or severe)     moderate 4. PATTERN: Is the pain constant? (e.g., yes, no; constant, intermittent)      constant 5. RADIATION: Does the pain shoot into your legs or somewhere else?     Through tail bone 6. CAUSE:  What do you think is causing the back pain?      Hx back pain 7. BACK OVERUSE:  Any recent lifting of heavy objects, strenuous work or exercise?     denies 8. MEDICINES: What have you taken so far for the pain? (e.g., nothing, acetaminophen , NSAIDS)     no 9. NEUROLOGIC SYMPTOMS: Do you have any weakness, numbness, or problems with bowel/bladder control?     no 10. OTHER SYMPTOMS: Do you  have any other symptoms? (e.g., fever, abdomen pain, burning with urination, blood in urine)       no 11. PREGNANCY: Is there any chance you are pregnant? When was your last menstrual period?       na  Protocols used: Back Pain-A-AH

## 2024-07-18 NOTE — Telephone Encounter (Unsigned)
 Copied from CRM [id number]. Topic: Clinical - Prescription Issue >> Jul 18, 2024  7:57 AM Rosaria BRAVO wrote: Reason for CRM: Pt wants to speak to [facility] regarding a prescription issue. Says the medication she was given last week is not working. Seeking appt today, has not been able to work.    Best contact: [phone]   ----------------------------------------------------------------------- From previous Reason for Contact - Scheduling: Patient/patient representative is calling to schedule an appointment. Refer to attachments for appointment information.

## 2024-07-19 ENCOUNTER — Ambulatory Visit: Admitting: Family Medicine

## 2024-08-03 ENCOUNTER — Telehealth: Payer: Self-pay | Admitting: Nurse Practitioner

## 2024-08-03 DIAGNOSIS — Z0279 Encounter for issue of other medical certificate: Secondary | ICD-10-CM

## 2024-08-03 NOTE — Telephone Encounter (Signed)
      [employer] faxed FMLA forms to be completed  Form Fee Paid? (Y/N)  YES     / BLANK COPY ABOVE     If NO, form is placed on front office manager desk to hold until payment received. If YES, then form will be placed in the RX/HH Nurse Coordinators box for completion.  Form will not be processed until payment is received

## 2024-08-05 NOTE — Telephone Encounter (Signed)
 PCP completed and signed disability forms. They have been faxed to [employer] at fax number [phone]. Patient has been contacted and informed they are complete.

## 2024-08-08 ENCOUNTER — Telehealth: Payer: Self-pay | Admitting: Family Medicine

## 2024-08-08 DIAGNOSIS — I700 Atherosclerosis of aorta: Secondary | ICD-10-CM | POA: Diagnosis not present

## 2024-08-08 DIAGNOSIS — M545 Low back pain, unspecified: Secondary | ICD-10-CM | POA: Diagnosis not present

## 2024-08-08 NOTE — Telephone Encounter (Signed)
 Patient requesting to speak to [facility]. Patient requesting callback today, [phone]

## 2024-08-08 NOTE — Telephone Encounter (Signed)
 Called and spoke with patient. She states that she saw her ortho today and they are scheduling her for 6 weeks of PT so her MRI will be covered by insurance. Per ortho note she would like for you to continue to write patient out of work until this is done. I included the notes from their visit today.  Ok to write note?

## 2024-08-08 NOTE — Telephone Encounter (Signed)
 Copied from CRM [id number]. Topic: Referral - Question >> Aug 08, 2024  2:17 PM Leonette P wrote: Reason for CRM: Pt went to [facility] today and his nurse will be sending a message to Ronal Quant to have the patient out of work until she has the MRI/ and PT.  CB#  [phone]

## 2024-08-09 NOTE — Telephone Encounter (Signed)
 Note written and left up front for patient pick up

## 2024-08-09 NOTE — Telephone Encounter (Signed)
 Please take care of work note

## 2024-08-17 ENCOUNTER — Encounter: Payer: Self-pay | Admitting: Physical Therapy

## 2024-08-17 ENCOUNTER — Ambulatory Visit: Payer: Self-pay | Attending: Family Medicine | Admitting: Physical Therapy

## 2024-08-17 DIAGNOSIS — M62830 Muscle spasm of back: Secondary | ICD-10-CM | POA: Insufficient documentation

## 2024-08-17 DIAGNOSIS — R293 Abnormal posture: Secondary | ICD-10-CM | POA: Insufficient documentation

## 2024-08-17 DIAGNOSIS — M5459 Other low back pain: Secondary | ICD-10-CM | POA: Diagnosis not present

## 2024-08-17 NOTE — Therapy (Signed)
 OUTPATIENT PHYSICAL THERAPY THORACOLUMBAR EVALUATION   Patient Name: [name] MRN: [id number] DOB:[birth year], 63 y.o., female Today's Date: 08/17/2024  END OF SESSION:  PT End of Session - 08/17/24 0918     Visit Number 1    Number of Visits 8    Date for Recertification  09/14/24    PT Start Time 0844    PT Stop Time 0910    PT Time Calculation (min) 26 min    Activity Tolerance Patient tolerated treatment well    Behavior During Therapy [facility] for tasks assessed/performed          Past Medical History:  Diagnosis Date   Cancer (HCC) 2012   Breast   Cervical muscle strain    Headache(784.0)    Lumbar strain    Thyroid  disease    Wears contact lenses    Wears dentures    upper   Past Surgical History:  Procedure Laterality Date   AMPUTATION Right 06/30/2020   Procedure: Revision of Right middle finger amputation;  Surgeon: Shari Easter, MD;  Location: [facility] OR;  Service: Orthopedics;  Laterality: Right;   BREAST SURGERY  04/10/2011   Right mastectomy and slnbx   LYMPH NODE BIOPSY  1996   rt groin   MULTIPLE TOOTH EXTRACTIONS     PORT-A-CATH REMOVAL  2012   insertion and out   RADIOACTIVE SEED GUIDED EXCISIONAL BREAST BIOPSY Left 01/16/2015   Procedure: RADIOACTIVE SEED GUIDED EXCISIONAL BREAST BIOPSY;  Surgeon: Alm Angle, MD;  Location: [facility];  Service: General;  Laterality: Left;   TUBAL LIGATION     Patient Active Problem List   Diagnosis Date Noted   Primary hypertension 11/29/2020   Hypothyroidism 08/27/2020   Depression, recurrent 08/27/2020   Breast cancer, right breast, Triple negative, CR to neoaduvant chemo, mastectomy 04/10/2011. 12/26/2011   REFERRING PROVIDER: Lauraine Hamlet NP  REFERRING DIAG: Low back pain.  Rationale for Evaluation and Treatment: Rehabilitation  THERAPY DIAG:  Other low back pain  Abnormal posture  Muscle spasm of back  ONSET DATE: About a year.    SUBJECTIVE:                                                                                                                                                                                            SUBJECTIVE STATEMENT: The patient presents to the clinic with c/o right-sided low back that came on for no apparent reason and has progressively worsened.  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  She has not be able to work  since August due to her pain.  Her resting pain-level is a 5-6/10 today but can rise to near severe levels.  She states even driving to [place] is very painful.  She describes the pain as shooting.  She has to weight shift off her right side frequently while seated.    PERTINENT HISTORY:  Please see above.    PAIN:  Are you having pain? Yes: NPRS scale: 5-6/10.   Pain location: Right low back.   Pain description: As above.   Aggravating factors: Sitting.   Relieving factors: Nothing really.    PRECAUTIONS: None  RED FLAGS: None   WEIGHT BEARING RESTRICTIONS: No  FALLS:  Has patient fallen in last 6 months? No  LIVING ENVIRONMENT: Lives in: House/apartment Has following equipment at home: None  OCCUPATION: Currently out of work at [employer].    PLOF: Independent  PATIENT GOALS: Not have pain.     OBJECTIVE:  Note: Objective measures were completed at Evaluation unless otherwise noted.  DIAGNOSTIC FINDINGS:  At MD office:  2V lumbar AP and lateral, done in our office today and reviewed by me. Reviewed with the patient. Lumbar scoliosis, convex to the left, with lumbar DDD from L3-4, L4-5 and L5-S1, with facet degeneration at these levels. Incidental finding of aortic atherosclerosis.   PATIENT SURVEYS:  ODI:  17/50.     POSTURE: rounded shoulders, forward head, decreased lumbar lordosis, and posterior pelvic tilt.  Scoliosis noted.    PALPATION: Tender to palpation over right lower musculature, SIJ and upper gluteal musculature.    LUMBAR ROM:    Active lumbar flexion limited by 25% and extension to 5 degrees.    LOWER EXTREMITY ROM:     WNL.  LOWER EXTREMITY MMT:    WNL.  LUMBAR SPECIAL TESTS:  Equal leg lengths. (-) SLR testing. (+) right FABER test.  Bilateral LE DTR's 1+ to 2+/4+.    GAIT:   TREATMENT DATE:                                                                                                                                  PATIENT EDUCATION:  Education details: Discussed use of lumbar roll and sleeping posture which patient has been doing.   Person educated: Patient Education method: Explanation Education comprehension: verbalized understanding  HOME EXERCISE PROGRAM:   ASSESSMENT:  CLINICAL IMPRESSION: The patient presents to OPPT with c/o chronic right-sided low back pain.  This has been worsening over the last year.  The longer she sits the worse her pain gets.  She has to weight shift frequently off her right side while seated.  She has multiple postural abnormalities.  She is tedner to palpation over her right lower lumbar musculature, SIJ and upper gluteal musculature.  She has pain reproduction with a right FABER test.  Her ODI score is 17/50.  The patient will benefit from skilled PT intervention to address pain and deficits.  OBJECTIVE IMPAIRMENTS: decreased activity tolerance, decreased ROM, increased muscle spasms, postural dysfunction, and pain.   ACTIVITY LIMITATIONS: carrying, lifting, bending, and sitting  PARTICIPATION LIMITATIONS: meal prep, cleaning, laundry, community activity, occupation, and yard work  PERSONAL FACTORS: Time since onset of injury/illness/exacerbation are also affecting patient's functional outcome.   REHAB POTENTIAL: Good  CLINICAL DECISION MAKING: Evolving/moderate complexity  EVALUATION COMPLEXITY: Moderate   GOALS:  SHORT TERM GOALS: Target date: 08/31/24  Ind with an initial HEP. Goal status: INITIAL   LONG TERM GOALS: Target date:  09/14/24.  Ind with an advanced HEP. Goal status: INITIAL  2.  Sit 20 minutes with pain not > 3/10.  Goal status: INITIAL  3.  Perform ADL's with pain not > 3/10.  Goal status: INITIAL  4.  Improve ODI score by at least 5 points.    Goal status: INITIAL  PLAN:  PT FREQUENCY: 2x/week  PT DURATION: 4 weeks  PLANNED INTERVENTIONS: 97110-Therapeutic exercises, 97530- Therapeutic activity, [id number]- Neuromuscular re-education, 97535- Self Care, 02859- Manual therapy, G0283- Electrical stimulation (unattended), 97035- Ultrasound, Patient/Family education, Cryotherapy, and Moist heat.  PLAN FOR NEXT SESSION: STW/M and modalities as needed.  Core exercise progression.     Quadir Muns, [place], PT 08/17/2024, 11:03 AM

## 2024-08-22 ENCOUNTER — Encounter: Payer: Self-pay | Admitting: *Deleted

## 2024-08-22 ENCOUNTER — Ambulatory Visit: Admitting: *Deleted

## 2024-08-22 ENCOUNTER — Telehealth: Payer: Self-pay | Admitting: Nurse Practitioner

## 2024-08-22 DIAGNOSIS — R293 Abnormal posture: Secondary | ICD-10-CM | POA: Diagnosis not present

## 2024-08-22 DIAGNOSIS — M62830 Muscle spasm of back: Secondary | ICD-10-CM

## 2024-08-22 DIAGNOSIS — M5459 Other low back pain: Secondary | ICD-10-CM

## 2024-08-22 DIAGNOSIS — Z0279 Encounter for issue of other medical certificate: Secondary | ICD-10-CM

## 2024-08-22 NOTE — Telephone Encounter (Signed)
 [employer] faxed STD forms to be completed  Form Fee Paid? (Y/N)       no lmtcb to collect payment     If NO, form is placed on front office manager desk to hold until payment received. If YES, then form will be placed in the RX/HH Nurse Coordinators box for completion.  Form will not be processed until payment is received

## 2024-08-22 NOTE — Therapy (Signed)
 OUTPATIENT PHYSICAL THERAPY THORACOLUMBAR TREATMENT   Patient Name: [name] MRN: [id number] DOB:[birth year], 63 y.o., female Today's Date: 08/22/2024  END OF SESSION:  PT End of Session - 08/22/24 0849     Visit Number 2    Date for Recertification  09/14/24    PT Start Time 0846    PT Stop Time 0932    PT Time Calculation (min) 46 min          Past Medical History:  Diagnosis Date   Cancer (HCC) 2012   Breast   Cervical muscle strain    Headache(784.0)    Lumbar strain    Thyroid  disease    Wears contact lenses    Wears dentures    upper   Past Surgical History:  Procedure Laterality Date   AMPUTATION Right 06/30/2020   Procedure: Revision of Right middle finger amputation;  Surgeon: Shari Easter, MD;  Location: [facility] OR;  Service: Orthopedics;  Laterality: Right;   BREAST SURGERY  04/10/2011   Right mastectomy and slnbx   LYMPH NODE BIOPSY  1996   rt groin   MULTIPLE TOOTH EXTRACTIONS     PORT-A-CATH REMOVAL  2012   insertion and out   RADIOACTIVE SEED GUIDED EXCISIONAL BREAST BIOPSY Left 01/16/2015   Procedure: RADIOACTIVE SEED GUIDED EXCISIONAL BREAST BIOPSY;  Surgeon: Alm Angle, MD;  Location: [facility];  Service: General;  Laterality: Left;   TUBAL LIGATION     Patient Active Problem List   Diagnosis Date Noted   Primary hypertension 11/29/2020   Hypothyroidism 08/27/2020   Depression, recurrent 08/27/2020   Breast cancer, right breast, Triple negative, CR to neoaduvant chemo, mastectomy 04/10/2011. 12/26/2011   REFERRING PROVIDER: Lauraine Hamlet NP  REFERRING DIAG: Low back pain.  Rationale for Evaluation and Treatment: Rehabilitation  THERAPY DIAG:  Other low back pain  Abnormal posture  Muscle spasm of back  ONSET DATE: About a year.    SUBJECTIVE:                                                                                                                                                                                            SUBJECTIVE STATEMENT: The patient reports 4/10 LBP RT side  PERTINENT HISTORY:  Please see above.    PAIN:  Are you having pain? Yes: NPRS scale: 4/10.   Pain location: Right low back.   Pain description: As above.   Aggravating factors: Sitting.   Relieving factors: Nothing really.    PRECAUTIONS: None  RED FLAGS: None   WEIGHT BEARING RESTRICTIONS: No  FALLS:  Has patient fallen in last 6 months?  No  LIVING ENVIRONMENT: Lives in: House/apartment Has following equipment at home: None  OCCUPATION: Currently out of work at [employer].    PLOF: Independent  PATIENT GOALS: Not have pain.     OBJECTIVE:  Note: Objective measures were completed at Evaluation unless otherwise noted.  DIAGNOSTIC FINDINGS:  At MD office:  2V lumbar AP and lateral, done in our office today and reviewed by me. Reviewed with the patient. Lumbar scoliosis, convex to the left, with lumbar DDD from L3-4, L4-5 and L5-S1, with facet degeneration at these levels. Incidental finding of aortic atherosclerosis.   PATIENT SURVEYS:  ODI:  17/50.     POSTURE: rounded shoulders, forward head, decreased lumbar lordosis, and posterior pelvic tilt.  Scoliosis noted.    PALPATION: Tender to palpation over right lower musculature, SIJ and upper gluteal musculature.    LUMBAR ROM:   Active lumbar flexion limited by 25% and extension to 5 degrees.    LOWER EXTREMITY ROM:     WNL.  LOWER EXTREMITY MMT:    WNL.  LUMBAR SPECIAL TESTS:  Equal leg lengths. (-) SLR testing. (+) right FABER test.  Bilateral LE DTR's 1+ to 2+/4+.    GAIT:   TREATMENT DATE: 08-22-24                                    EXERCISE LOG  Exercise Repetitions and Resistance Comments  Nustep L3 x 10 mins   Dying bug X 6 hold 10 secs  HEP 2sets   Bridge X 10 hold 5 secs HEP  2 sets  Cat/camel      LT side pos. Traction small bolster X 10 mins    Blank cell = exercise not performed today    Manual STW to RT side LB  paras while in positional traction x 10 mins                                                                                                                               PATIENT EDUCATION:  Education details: Discussed use of lumbar roll and sleeping posture which patient has been doing.   Person educated: Patient Education method: Explanation Education comprehension: verbalized understanding  HOME EXERCISE PROGRAM:   ASSESSMENT:  CLINICAL IMPRESSION: The patient presents to OPPT with c/o chronic right-sided low back pain.  Rx focused on discussion and review of postures and movement patterns to decrease pain triggers with ADL's, core strengthening, positional traction as well as STW and did fairly well. Handout given for HEP.    OBJECTIVE IMPAIRMENTS: decreased activity tolerance, decreased ROM, increased muscle spasms, postural dysfunction, and pain.   ACTIVITY LIMITATIONS: carrying, lifting, bending, and sitting  PARTICIPATION LIMITATIONS: meal prep, cleaning, laundry, community activity, occupation, and yard work  PERSONAL FACTORS: Time since onset of injury/illness/exacerbation are also affecting patient's functional outcome.   REHAB POTENTIAL: Good  CLINICAL DECISION  MAKING: Evolving/moderate complexity  EVALUATION COMPLEXITY: Moderate   GOALS:  SHORT TERM GOALS: Target date: 08/31/24  Ind with an initial HEP. Goal status: INITIAL   LONG TERM GOALS: Target date: 09/14/24.  Ind with an advanced HEP. Goal status: INITIAL  2.  Sit 20 minutes with pain not > 3/10.  Goal status: INITIAL  3.  Perform ADL's with pain not > 3/10.  Goal status: INITIAL  4.  Improve ODI score by at least 5 points.    Goal status: INITIAL  PLAN:  PT FREQUENCY: 2x/week  PT DURATION: 4 weeks  PLANNED INTERVENTIONS: 97110-Therapeutic exercises, 97530- Therapeutic activity, [id number]- Neuromuscular re-education, 97535- Self Care, 02859- Manual therapy, G0283- Electrical stimulation  (unattended), 97035- Ultrasound, Patient/Family education, Cryotherapy, and Moist heat.  PLAN FOR NEXT SESSION: NO MODALITIESas per Ins.         .  Core exercise progression.  STW   Bodin Gorka,CHRIS, PTA 08/22/2024, 11:48 AM

## 2024-08-24 ENCOUNTER — Ambulatory Visit: Attending: Family Medicine | Admitting: Physical Therapy

## 2024-08-24 ENCOUNTER — Encounter: Payer: Self-pay | Admitting: Physical Therapy

## 2024-08-24 DIAGNOSIS — M62830 Muscle spasm of back: Secondary | ICD-10-CM | POA: Insufficient documentation

## 2024-08-24 DIAGNOSIS — R293 Abnormal posture: Secondary | ICD-10-CM | POA: Insufficient documentation

## 2024-08-24 DIAGNOSIS — M5459 Other low back pain: Secondary | ICD-10-CM | POA: Diagnosis not present

## 2024-08-24 NOTE — Telephone Encounter (Signed)
 LMOVM paperwork to [employer] has been faxed.

## 2024-08-24 NOTE — Therapy (Signed)
 OUTPATIENT PHYSICAL THERAPY THORACOLUMBAR TREATMENT   Patient Name: [name] MRN: [id number] DOB:03-Nov-1961, 63 y.o., female Today's Date: 08/24/2024  END OF SESSION:  PT End of Session - 08/24/24 0757     Visit Number 3    Date for Recertification  09/14/24    Authorization Type [employer]    Authorization - Visit Number 3    Authorization - Number of Visits 30    PT Start Time 0800    PT Stop Time 0840    PT Time Calculation (min) 40 min           Past Medical History:  Diagnosis Date   Cancer (HCC) 2012   Breast   Cervical muscle strain    Headache(784.0)    Lumbar strain    Thyroid  disease    Wears contact lenses    Wears dentures    upper   Past Surgical History:  Procedure Laterality Date   AMPUTATION Right 06/30/2020   Procedure: Revision of Right middle finger amputation;  Surgeon: Shari Easter, MD;  [facility] OR;  Service: Orthopedics;  Laterality: Right;   BREAST SURGERY  04/10/2011   Right mastectomy and slnbx   LYMPH NODE BIOPSY  1996   rt groin   MULTIPLE TOOTH EXTRACTIONS     PORT-A-CATH REMOVAL  2012   insertion and out   RADIOACTIVE SEED GUIDED EXCISIONAL BREAST BIOPSY Left 01/16/2015   Procedure: RADIOACTIVE SEED GUIDED EXCISIONAL BREAST BIOPSY;  Surgeon: Alm Angle, MD;  Location:  S[facility]ERY [facility];  Service: General;  Laterality: Left;   TUBAL LIGATION     Patient Active Problem List   Diagnosis Date Noted   Primary hypertension 11/29/2020   Hypothyroidism 08/27/2020   Depression, recurrent 08/27/2020   Breast cancer, right breast, Triple negative, CR to neoaduvant chemo, mastectomy 04/10/2011. 12/26/2011   REFERRING PROVIDER: Lauraine Hamlet NP  REFERRING DIAG: Low back pain.  Rationale for Evaluation and Treatment: Rehabilitation  THERAPY DIAG:  Other low back pain  Abnormal posture  Muscle spasm of back  ONSET DATE: About a year.    SUBJECTIVE:                                                                                                                                                                                            SUBJECTIVE STATEMENT: Pt states she felt good after last session with manual massage. Back feels sore this morning. Pt states she did her 2 exercises.   PERTINENT HISTORY:  Please see above.    PAIN:  Are you having pain? Yes: NPRS scale: 5/10.   Pain location: Right low back.  Pain description: As above.   Aggravating factors: Sitting.   Relieving factors: Nothing really.    PRECAUTIONS: None  WEIGHT BEARING RESTRICTIONS: No  FALLS:  Has patient fallen in last 6 months? No  LIVING ENVIRONMENT: Lives in: House/apartment Has following equipment at home: None  OCCUPATION: Currently out of work at [employer].    PATIENT GOALS: Not have pain.     OBJECTIVE:  Note: Objective measures were completed at Evaluation unless otherwise noted.  DIAGNOSTIC FINDINGS:  At MD office:  2V lumbar AP and lateral, done in our office today and reviewed by me. Reviewed with the patient. Lumbar scoliosis, convex to the left, with lumbar DDD from L3-4, L4-5 and L5-S1, with facet degeneration at these levels. Incidental finding of aortic atherosclerosis.   PATIENT SURVEYS:  ODI:  17/50.    POSTURE: rounded shoulders, forward head, decreased lumbar lordosis, and posterior pelvic tilt.  Scoliosis noted.    PALPATION: Tender to palpation over right lower musculature, SIJ and upper gluteal musculature.    LUMBAR ROM:  Active lumbar flexion limited by 25% and extension to 5 degrees.    LOWER EXTREMITY ROM:    WNL.  LOWER EXTREMITY MMT:   WNL.  LUMBAR SPECIAL TESTS:  Equal leg lengths. (-) SLR testing. (+) right FABER test.  Bilateral LE DTR's 1+ to 2+/4+.     TREATMENT DATE:  08-24-24                                   EXERCISE LOG  Exercise Repetitions and Resistance Comments  Nustep L3 x 10 mins   Seated pball fwd flexion 5 x10 sec hold   Seated pball fwd lateral flexion  x30 sec hold   Seated cat/cow X 10   L sidelying on bolster for positional traction, R LE off EOB X 15 min with TPR and STM of R QL and glute med/max   L sidelying clamshell  2 x10   Supine LTR 10 x 5 sec hold   Supine piriformis stretch X30 sec   Dying bug 6 x10 sec hold   Bridge 10 x 5 sec hold            08-22-24                                   EXERCISE LOG  Exercise Repetitions and Resistance Comments  Nustep L3 x 10 mins   Dying bug X 6 hold 10 secs  HEP 2sets   Bridge X 10 hold 5 secs HEP  2 sets  Cat/camel      LT side pos. Traction small bolster X 10 mins    Blank cell = exercise not performed today    Manual STW to RT side LB paras while in positional traction x 10 mins  PATIENT EDUCATION:  Education details: Discussed use of lumbar roll and sleeping posture which patient has been doing.   Person educated: Patient Education method: Explanation Education comprehension: verbalized understanding  HOME EXERCISE PROGRAM:   ASSESSMENT:  CLINICAL IMPRESSION: Pt had good response to manual work -- continued this to improve R low back and glute muscle spasm.  Reviewed HEP with some modifications to decrease back soreness and increase glute and core strength. Provided pt stretches to work on her muscle tightness at home.   OBJECTIVE IMPAIRMENTS: decreased activity tolerance, decreased ROM, increased muscle spasms, postural dysfunction, and pain.    GOALS:  SHORT TERM GOALS: Target date: 08/31/24  Ind with an initial HEP. Goal status: INITIAL   LONG TERM GOALS: Target date: 09/14/24.  Ind with an advanced HEP. Goal status: INITIAL  2.  Sit 20 minutes with pain not > 3/10.  Goal status: INITIAL  3.  Perform ADL's with pain not > 3/10.  Goal status: INITIAL  4.  Improve ODI score by at least 5 points.    Goal status:  INITIAL  PLAN:  PT FREQUENCY: 2x/week  PT DURATION: 4 weeks  PLANNED INTERVENTIONS: 97110-Therapeutic exercises, 97530- Therapeutic activity, [id number]- Neuromuscular re-education, 97535- Self Care, 02859- Manual therapy, G0283- Electrical stimulation (unattended), 97035- Ultrasound, Patient/Family education, Cryotherapy, and Moist heat.  PLAN FOR NEXT SESSION: NO MODALITIESas per Ins. Core exercise progression.  STW   Leontine Radman April Ma L Jahquez Steffler, PT 08/24/2024, 7:57 AM

## 2024-08-29 ENCOUNTER — Ambulatory Visit: Admitting: *Deleted

## 2024-08-29 ENCOUNTER — Encounter: Payer: Self-pay | Admitting: *Deleted

## 2024-08-29 DIAGNOSIS — M5459 Other low back pain: Secondary | ICD-10-CM

## 2024-08-29 DIAGNOSIS — R293 Abnormal posture: Secondary | ICD-10-CM

## 2024-08-29 DIAGNOSIS — M62830 Muscle spasm of back: Secondary | ICD-10-CM | POA: Diagnosis not present

## 2024-08-29 NOTE — Therapy (Signed)
 OUTPATIENT PHYSICAL THERAPY THORACOLUMBAR TREATMENT   Patient Name: [name] MRN: [id number] DOB:1961-02-13, 63 y.o., female Today's Date: 08/29/2024  END OF SESSION:  PT End of Session - 08/29/24 0756     Visit Number 4    Number of Visits 8    Date for Recertification  09/14/24    Authorization - Number of Visits 30    PT Start Time 0800    PT Stop Time 0846    PT Time Calculation (min) 46 min           Past Medical History:  Diagnosis Date   Cancer (HCC) 2012   Breast   Cervical muscle strain    Headache(784.0)    Lumbar strain    Thyroid  disease    Wears contact lenses    Wears dentures    upper   Past Surgical History:  Procedure Laterality Date   AMPUTATION Right 06/30/2020   Procedure: Revision of Right middle finger amputation;  Surgeon: Shari Easter, MD;  Location: [facility] OR;  Service: Orthopedics;  Laterality: Right;   BREAST SURGERY  04/10/2011   Right mastectomy and slnbx   LYMPH NODE BIOPSY  1996   rt groin   MULTIPLE TOOTH EXTRACTIONS     PORT-A-CATH REMOVAL  2012   insertion and out   RADIOACTIVE SEED GUIDED EXCISIONAL BREAST BIOPSY Left 01/16/2015   Procedure: RADIOACTIVE SEED GUIDED EXCISIONAL BREAST BIOPSY;  Surgeon: Alm Angle, MD;  Location: [facility];  Service: General;  Laterality: Left;   TUBAL LIGATION     Patient Active Problem List   Diagnosis Date Noted   Primary hypertension 11/29/2020   Hypothyroidism 08/27/2020   Depression, recurrent 08/27/2020   Breast cancer, right breast, Triple negative, CR to neoaduvant chemo, mastectomy 04/10/2011. 12/26/2011   REFERRING PROVIDER: Lauraine Hamlet NP  REFERRING DIAG: Low back pain.  Rationale for Evaluation and Treatment: Rehabilitation  THERAPY DIAG:  Other low back pain  Abnormal posture  ONSET DATE: About a year.    SUBJECTIVE:                                                                                                                                                                                            SUBJECTIVE STATEMENT: Pt states she did okay after last Rx, but very tight   4/10 today  PERTINENT HISTORY:  Please see above.    PAIN:  Are you having pain? Yes: NPRS scale: 4/10.   Pain location: Right low back.   Pain description: As above.   Aggravating factors: Sitting.   Relieving factors: Nothing really.    PRECAUTIONS: None  WEIGHT BEARING RESTRICTIONS: No  FALLS:  Has patient fallen in last 6 months? No  LIVING ENVIRONMENT: Lives in: House/apartment Has following equipment at home: None  OCCUPATION: Currently out of work at [employer].    PATIENT GOALS: Not have pain.     OBJECTIVE:  Note: Objective measures were completed at Evaluation unless otherwise noted.  DIAGNOSTIC FINDINGS:  At MD office:  2V lumbar AP and lateral, done in our office today and reviewed by me. Reviewed with the patient. Lumbar scoliosis, convex to the left, with lumbar DDD from L3-4, L4-5 and L5-S1, with facet degeneration at these levels. Incidental finding of aortic atherosclerosis.   PATIENT SURVEYS:  ODI:  17/50.    POSTURE: rounded shoulders, forward head, decreased lumbar lordosis, and posterior pelvic tilt.  Scoliosis noted.    PALPATION: Tender to palpation over right lower musculature, SIJ and upper gluteal musculature.    LUMBAR ROM:  Active lumbar flexion limited by 25% and extension to 5 degrees.    LOWER EXTREMITY ROM:    WNL.  LOWER EXTREMITY MMT:   WNL.  LUMBAR SPECIAL TESTS:  Equal leg lengths. (-) SLR testing. (+) right FABER test.  Bilateral LE DTR's 1+ to 2+/4+.     TREATMENT DATE:   08-29-24 Nustep  L3 x 10 mins Reviewed HEP Dying bug, bridge, and added cat/camel x 10 Positional traction with small bolster LT SL  x 15 mins Manual STW  and TPR to RT QL and paras while in traction x 12 mins        08-24-24                                   EXERCISE LOG  Exercise Repetitions and Resistance Comments   Nustep L3 x 10 mins   Seated pball fwd flexion 5 x10 sec hold   Seated pball fwd lateral flexion x30 sec hold   Seated cat/cow X 10   L sidelying on bolster for positional traction, R LE off EOB X 15 min with TPR and STM of R QL and glute med/max   L sidelying clamshell  2 x10   Supine LTR 10 x 5 sec hold   Supine piriformis stretch X30 sec   Dying bug 6 x10 sec hold   Bridge 10 x 5 sec hold            08-22-24                                   EXERCISE LOG  Exercise Repetitions and Resistance Comments  Nustep L3 x 10 mins   Dying bug X 6 hold 10 secs  HEP 2sets   Bridge X 10 hold 5 secs HEP  2 sets  Cat/camel      LT side pos. Traction small bolster X 10 mins    Blank cell = exercise not performed today    Manual STW to RT side LB paras while in positional traction x 10 mins  PATIENT EDUCATION:  Education details: Discussed use of lumbar roll and sleeping posture which patient has been doing.   Person educated: Patient Education method: Explanation Education comprehension: verbalized understanding  HOME EXERCISE PROGRAM:   ASSESSMENT:  CLINICAL IMPRESSION: Pt arrived doing some better today with decreased mm tightness and pain. Rx focused on therex and positional traction as well as STW to decrease MM tightness and pain in LB paras. Pt also performed cat/ camel and did well and was added to HEP. 2/10 end of session.   OBJECTIVE IMPAIRMENTS: decreased activity tolerance, decreased ROM, increased muscle spasms, postural dysfunction, and pain.    GOALS:  SHORT TERM GOALS: Target date: 08/31/24  Ind with an initial HEP. Goal status: INITIAL   LONG TERM GOALS: Target date: 09/14/24.  Ind with an advanced HEP. Goal status: INITIAL  2.  Sit 20 minutes with pain not > 3/10.  Goal status: INITIAL  3.  Perform ADL's with pain not > 3/10.   Goal status: INITIAL  4.  Improve ODI score by at least 5 points.    Goal status: INITIAL  PLAN:  PT FREQUENCY: 2x/week  PT DURATION: 4 weeks  PLANNED INTERVENTIONS: 97110-Therapeutic exercises, 97530- Therapeutic activity, [id number]- Neuromuscular re-education, 97535- Self Care, 02859- Manual therapy, G0283- Electrical stimulation (unattended), 97035- Ultrasound, Patient/Family education, Cryotherapy, and Moist heat.  PLAN FOR NEXT SESSION: NO MODALITIESas per Ins. Core exercise progression.  STW   Hashim Eichhorst,CHRIS, PTA 08/29/2024, 9:36 AM

## 2024-08-31 ENCOUNTER — Ambulatory Visit

## 2024-08-31 DIAGNOSIS — R293 Abnormal posture: Secondary | ICD-10-CM | POA: Diagnosis not present

## 2024-08-31 DIAGNOSIS — M62830 Muscle spasm of back: Secondary | ICD-10-CM | POA: Diagnosis not present

## 2024-08-31 DIAGNOSIS — M5459 Other low back pain: Secondary | ICD-10-CM | POA: Diagnosis not present

## 2024-08-31 NOTE — Therapy (Signed)
 OUTPATIENT PHYSICAL THERAPY THORACOLUMBAR TREATMENT   Patient Name: [name] MRN: [id number] DOB:07/03/1961, 63 y.o., female Today's Date: 08/31/2024  END OF SESSION:  PT End of Session - 08/31/24 0802     Visit Number 5    Number of Visits 8    Date for Recertification  09/14/24    Authorization - Number of Visits 30    PT Start Time 0800    PT Stop Time 0841    PT Time Calculation (min) 41 min           Past Medical History:  Diagnosis Date   Cancer (HCC) 2012   Breast   Cervical muscle strain    Headache(784.0)    Lumbar strain    Thyroid  disease    Wears contact lenses    Wears dentures    upper   Past Surgical History:  Procedure Laterality Date   AMPUTATION Right 06/30/2020   Procedure: Revision of Right middle finger amputation;  Surgeon: Shari Easter, MD;  Location: [facility] OR;  Service: Orthopedics;  Laterality: Right;   BREAST SURGERY  04/10/2011   Right mastectomy and slnbx   LYMPH NODE BIOPSY  1996   rt groin   MULTIPLE TOOTH EXTRACTIONS     PORT-A-CATH REMOVAL  2012   insertion and out   RADIOACTIVE SEED GUIDED EXCISIONAL BREAST BIOPSY Left 01/16/2015   Procedure: RADIOACTIVE SEED GUIDED EXCISIONAL BREAST BIOPSY;  Surgeon: Alm Angle, MD;  Location: [facility];  Service: General;  Laterality: Left;   TUBAL LIGATION     Patient Active Problem List   Diagnosis Date Noted   Primary hypertension 11/29/2020   Hypothyroidism 08/27/2020   Depression, recurrent 08/27/2020   Breast cancer, right breast, Triple negative, CR to neoaduvant chemo, mastectomy 04/10/2011. 12/26/2011   REFERRING PROVIDER: Lauraine Hamlet NP  REFERRING DIAG: Low back pain.  Rationale for Evaluation and Treatment: Rehabilitation  THERAPY DIAG:  Other low back pain  Abnormal posture  Muscle spasm of back  ONSET DATE: About a year.    SUBJECTIVE:                                                                                                                                                                                            SUBJECTIVE STATEMENT: Pt states she did okay after last Rx, but very tight   4/10 today  PERTINENT HISTORY:  Please see above.    PAIN:  Are you having pain? Yes: NPRS scale: 4/10.   Pain location: Right low back.   Pain description: As above.   Aggravating factors: Sitting.   Relieving factors: Nothing really.  PRECAUTIONS: None  WEIGHT BEARING RESTRICTIONS: No  FALLS:  Has patient fallen in last 6 months? No  LIVING ENVIRONMENT: Lives in: House/apartment Has following equipment at home: None  OCCUPATION: Currently out of work at [employer].    PATIENT GOALS: Not have pain.     OBJECTIVE:  Note: Objective measures were completed at Evaluation unless otherwise noted.  DIAGNOSTIC FINDINGS:  At MD office:  2V lumbar AP and lateral, done in our office today and reviewed by me. Reviewed with the patient. Lumbar scoliosis, convex to the left, with lumbar DDD from L3-4, L4-5 and L5-S1, with facet degeneration at these levels. Incidental finding of aortic atherosclerosis.   PATIENT SURVEYS:  ODI:  17/50.    POSTURE: rounded shoulders, forward head, decreased lumbar lordosis, and posterior pelvic tilt.  Scoliosis noted.    PALPATION: Tender to palpation over right lower musculature, SIJ and upper gluteal musculature.    LUMBAR ROM:  Active lumbar flexion limited by 25% and extension to 5 degrees.    LOWER EXTREMITY ROM:    WNL.  LOWER EXTREMITY MMT:   WNL.  LUMBAR SPECIAL TESTS:  Equal leg lengths. (-) SLR testing. (+) right FABER test.  Bilateral LE DTR's 1+ to 2+/4+.     TREATMENT DATE:    08/31/24                                 EXERCISE LOG  Exercise Repetitions and Resistance Comments  Nustep  Lvl 3 x 15 mins   Ball Roll Outs Seated x 5 mins   [employer]  Seated x 5 mins   Rockerboard 3 mins   Lunges 14 box x 3 mins each    Blank cell = exercise not performed today   Manual  Therapy Soft Tissue Mobilization: Right low back, STW/M to right lumbar paraspinals to decrease pain and tone    08-29-24 Nustep  L3 x 10 mins Reviewed HEP Dying bug, bridge, and added cat/camel x 10 Positional traction with small bolster LT SL  x 15 mins Manual STW  and TPR to RT QL and paras while in traction x 12 mins  08-24-24                                   EXERCISE LOG  Exercise Repetitions and Resistance Comments  Nustep L3 x 10 mins   Seated pball fwd flexion 5 x10 sec hold   Seated pball fwd lateral flexion x30 sec hold   Seated cat/cow X 10   L sidelying on bolster for positional traction, R LE off EOB X 15 min with TPR and STM of R QL and glute med/max   L sidelying clamshell  2 x10   Supine LTR 10 x 5 sec hold   Supine piriformis stretch X30 sec   Dying bug 6 x10 sec hold   Bridge 10 x 5 sec hold            PATIENT EDUCATION:  Education details: Discussed use of lumbar roll and sleeping posture which patient has been doing.   Person educated: Patient Education method: Explanation Education comprehension: verbalized understanding  HOME EXERCISE PROGRAM:   ASSESSMENT:  CLINICAL IMPRESSION: Pt arrives for today's treatment session reporting 4/10 low back pain and tightness. Pt states that she has not seen much change in her pain at this point.  Pt introduced to seated physio-ball exercises today to increase ROM of low back.  Pt also introduced to standing exercises today with good results.  Pt requiring min cues for proper technique with newly added exercises.  STW/M performed to right lumbar paraspinals to decrease pain and tone.  Pt reported 2/10 low back pain at completion of today's treatment session.   OBJECTIVE IMPAIRMENTS: decreased activity tolerance, decreased ROM, increased muscle spasms, postural dysfunction, and pain.    GOALS:  SHORT TERM GOALS: Target date: 08/31/24  Ind with an initial HEP. Goal status: INITIAL   LONG TERM GOALS: Target  date: 09/14/24.  Ind with an advanced HEP. Goal status: INITIAL  2.  Sit 20 minutes with pain not > 3/10.  Goal status: INITIAL  3.  Perform ADL's with pain not > 3/10.  Goal status: INITIAL  4.  Improve ODI score by at least 5 points.    Goal status: INITIAL  PLAN:  PT FREQUENCY: 2x/week  PT DURATION: 4 weeks  PLANNED INTERVENTIONS: 97110-Therapeutic exercises, 97530- Therapeutic activity, [id number]- Neuromuscular re-education, 97535- Self Care, 02859- Manual therapy, G0283- Electrical stimulation (unattended), 97035- Ultrasound, Patient/Family education, Cryotherapy, and Moist heat.  PLAN FOR NEXT SESSION: NO MODALITIESas per Ins. Core exercise progression.  STW   Delon DELENA Gosling, PTA 08/31/2024, 8:49 AM

## 2024-09-05 ENCOUNTER — Encounter: Admitting: Physical Therapy

## 2024-09-05 ENCOUNTER — Telehealth: Payer: Self-pay

## 2024-09-05 ENCOUNTER — Other Ambulatory Visit: Payer: Self-pay | Admitting: Nurse Practitioner

## 2024-09-05 DIAGNOSIS — F419 Anxiety disorder, unspecified: Secondary | ICD-10-CM

## 2024-09-05 NOTE — Telephone Encounter (Signed)
 Patient requesting that her work note be extended for another 2 weeks. She is waiting for the specialist to schedule her MRI and they are waiting on insurance approval. Note written and left up front for patient pick up

## 2024-09-05 NOTE — Telephone Encounter (Signed)
 Copied from CRM [id number]. Topic: Clinical - Request for Lab/Test Order >> Sep 05, 2024  8:13 AM Susanna ORN wrote: Reason for CRM: Patient is requesting for Dr. Ky nurse, Lorn, to give her a call. She states she's waiting on them to schedule her for an MRI and she's being taken off work for it. Please have Mandy to contact patient. CB #: [phone].

## 2024-09-07 ENCOUNTER — Encounter: Admitting: *Deleted

## 2024-09-15 DIAGNOSIS — M545 Low back pain, unspecified: Secondary | ICD-10-CM | POA: Diagnosis not present

## 2024-09-15 NOTE — Telephone Encounter (Signed)
 I called and spoke with patient and she says she needs her work note extended again until 10/12/24 because that's how long she has to wait until she can see the specialist.   Copied from CRM [id number]. Topic: General - Other >> Sep 15, 2024  8:40 AM Montie POUR wrote: Reason for CRM:  Please have FNP Martin[employer] nurse to call [name] about her leave of absence from work. Her number is [phone].

## 2024-09-15 NOTE — Telephone Encounter (Signed)
Not sure what she needs.

## 2024-09-15 NOTE — Telephone Encounter (Signed)
 Work note written and left up front for patient pick up. Patient notified and verbalized understanding

## 2024-09-15 NOTE — Telephone Encounter (Signed)
 Ok to extend work note

## 2024-09-22 ENCOUNTER — Telehealth: Payer: Self-pay | Admitting: Nurse Practitioner

## 2024-09-22 DIAGNOSIS — Z0279 Encounter for issue of other medical certificate: Secondary | ICD-10-CM

## 2024-09-22 NOTE — Telephone Encounter (Signed)
 [employer]  ins com faxed  forms to be completed  Form Fee Paid? (Yes)            If NO, form is placed on front office manager desk to hold until payment received. If YES, then form will be placed in the RX/HH Nurse Coordinators box for completion.  Form will not be processed until payment is received

## 2024-09-26 DIAGNOSIS — H5213 Myopia, bilateral: Secondary | ICD-10-CM | POA: Diagnosis not present

## 2024-09-30 NOTE — Telephone Encounter (Signed)
 PCP completed and signed physician's statement forms. They have been faxed to [employer] at fax number [phone]. Patient has been contacted and informed they are complete. Copy mailed.

## 2024-09-30 NOTE — Telephone Encounter (Signed)
Is it ready?

## 2024-09-30 NOTE — Telephone Encounter (Signed)
 Pt called to check the status of her paperwork, she wants someone to call her today. She is ready to pay for this service.   Best contact: [phone]

## 2024-10-03 ENCOUNTER — Other Ambulatory Visit: Payer: Self-pay | Admitting: *Deleted

## 2024-10-03 DIAGNOSIS — F419 Anxiety disorder, unspecified: Secondary | ICD-10-CM

## 2024-10-12 DIAGNOSIS — M47819 Spondylosis without myelopathy or radiculopathy, site unspecified: Secondary | ICD-10-CM | POA: Diagnosis not present

## 2024-10-12 DIAGNOSIS — M419 Scoliosis, unspecified: Secondary | ICD-10-CM | POA: Diagnosis not present

## 2024-11-08 DIAGNOSIS — M5416 Radiculopathy, lumbar region: Secondary | ICD-10-CM | POA: Diagnosis not present

## 2024-12-29 ENCOUNTER — Ambulatory Visit

## 2024-12-29 ENCOUNTER — Encounter: Payer: Self-pay | Admitting: Nurse Practitioner

## 2024-12-29 ENCOUNTER — Ambulatory Visit: Admitting: Nurse Practitioner

## 2024-12-29 ENCOUNTER — Ambulatory Visit: Payer: Self-pay | Admitting: *Deleted

## 2024-12-29 VITALS — BP 155/81 | HR 118 | Ht 68.0 in | Wt 105.0 lb

## 2024-12-29 DIAGNOSIS — S93401A Sprain of unspecified ligament of right ankle, initial encounter: Secondary | ICD-10-CM | POA: Diagnosis not present

## 2024-12-29 DIAGNOSIS — M25571 Pain in right ankle and joints of right foot: Secondary | ICD-10-CM

## 2024-12-29 NOTE — Telephone Encounter (Signed)
" °  FYI Only or Action Required?: FYI only for provider: appointment scheduled on 2/5.  Patient was last seen in primary care on 07/12/2024 by Gladis Mustard, FNP.  Called Nurse Triage reporting Foot Swelling.  Symptoms began several weeks ago.  Interventions attempted: Rest, hydration, or home remedies.  Symptoms are: unchanged.  Triage Disposition: See PCP When Office is Open (Within 3 Days)  Patient/caregiver understands and will follow disposition?: Yes   Message from Antwanette L sent at 12/29/2024  8:04 AM EST  Reason for Triage: pt is having pain and swelling in her right foot.   Reason for Disposition  MODERATE ankle swelling (e.g., interferes with normal activities, can't move joint normally) (Exceptions: Itchy, localized swelling; swelling is chronic.)  Answer Assessment - Initial Assessment Questions 1. LOCATION: Which ankle is swollen? Where is the swelling?     R foot and ankle 2. ONSET: When did the swelling start?     Patient reports fall 2 weeks ago- swelling comes and goes 3. SWELLING: How bad is the swelling? Or, How large is it? (e.g., mild, moderate, severe; size of localized swelling)      Not too bad mild/moderate swelling, outer side of ankle and foot 4. PAIN: Is there any pain? If Yes, ask: How bad is it? (Scale 0-10; or none, mild, moderate, severe)     Yes- 5/10 5. CAUSE: What do you think caused the ankle swelling?     Possible injury 6. OTHER SYMPTOMS: Do you have any other symptoms? (e.g., fever, chest pain, difficulty breathing, calf pain)     no  Protocols used: Ankle Swelling-A-AH  "

## 2024-12-29 NOTE — Progress Notes (Signed)
" ° °  Subjective:    Patient ID: [name], female    DOB: 05/27/1961, 64 y.o.   MRN: [id number]   Chief Complaint: Foot Swelling (Right ankle.)   HPI  Patient in c/o right ankle swelling. She feel about 2 weeks ago and has been swelling since. Mild pain. Pain is worse wit weight bearing or walking.  Patient Active Problem List   Diagnosis Date Noted   Primary hypertension 11/29/2020   Hypothyroidism 08/27/2020   Depression, recurrent 08/27/2020   Breast cancer, right breast, Triple negative, CR to neoaduvant chemo, mastectomy 04/10/2011. 12/26/2011       Review of Systems  Constitutional:  Negative for diaphoresis.  Eyes:  Negative for pain.  Respiratory:  Negative for shortness of breath.   Cardiovascular:  Negative for chest pain, palpitations and leg swelling.  Gastrointestinal:  Negative for abdominal pain.  Endocrine: Negative for polydipsia.  Skin:  Negative for rash.  Neurological:  Negative for dizziness, weakness and headaches.  Hematological:  Does not bruise/bleed easily.  All other systems reviewed and are negative.      Objective:   Physical Exam Constitutional:      Appearance: Normal appearance.  Cardiovascular:     Rate and Rhythm: Normal rate and regular rhythm.     Heart sounds: Normal heart sounds.  Pulmonary:     Effort: Pulmonary effort is normal.     Breath sounds: Normal breath sounds.  Musculoskeletal:     Comments: Mild right ankle edema FROM with pain on full flexion   Skin:    General: Skin is warm.  Neurological:     General: No focal deficit present.     Mental Status: She is alert and oriented to person, place, and time.  Psychiatric:        Mood and Affect: Mood normal.        Behavior: Behavior normal.    BP (!) 155/81   Pulse (!) 118   Ht 5' 8 (1.727 m)   Wt 105 lb (47.6 kg)   SpO2 97%   BMI 15.97 kg/m   Right ankle xray- Preliminary reading by Ronal Lunger, FNP  [facility]         Assessment & Plan:   [name] in today with chief complaint of Foot Swelling (Right ankle.)   1. Acute right ankle pain (Primary) - DG Ankle Complete Right  2. Sprain of right ankle, unspecified ligament, initial encounter Motrin OTC Rest Ice Compression wrap Elevate     The above assessment and management plan was discussed with the patient. The patient verbalized understanding of and has agreed to the management plan. Patient is aware to call the clinic if symptoms persist or worsen. Patient is aware when to return to the clinic for a follow-up visit. Patient educated on when it is appropriate to go to the emergency department.   Mary-Margaret Lunger, FNP    "

## 2024-12-29 NOTE — Patient Instructions (Signed)
 RICE Therapy for Routine Care of Injuries Many injuries can be cared for with rest, ice, compression, and elevation. This is also called RICE therapy. RICE therapy includes: Resting the injured body part. Putting ice on the injury. Putting pressure on the injury. This is also called compression. Raising the injured part. This is also called elevation. RICE therapy can help reduce pain and swelling. Supplies needed: Ice. Plastic bag. Towel. Elastic bandage. Pillow or pillows to raise the injured body part. How to care for your injury with RICE therapy Rest Try to rest the injured part of your body. You can go back to your normal activities when your health care provider says it's okay to do them and when you can do them without pain. Ask what things are safe for you to do. Some injuries heal better with early movement instead of resting. If you rest the injury too much, it may not heal as well. Ask your provider if you should do exercises to help your injury get better. Ice Putting ice on your injury can help to lessen swelling and pain. Do not apply ice directly to your skin. Use ice on as many days as told by your provider. If told, put ice on the area. Put ice in a plastic bag. Place a towel between your skin and the bag. Leave the ice on for 20 minutes, 2-3 times a day. If your skin turns bright red, take off the ice right away to prevent skin damage. The risk of damage is higher if you can't feel pain, heat, or cold.  Compression Put pressure, also called compression, on your injured area. This can be done with an elastic bandage. If this type of bandage has been put on your injury: Follow instructions on the package the bandage came in about how to use it. Do not wrap the bandage too tightly. Wrap the bandage more loosely if part of your body beyond the bandage looks blue, or is swollen, cold, painful, or loses feeling. Take off the bandage and put it on again every 3-4 hours or  as told by your provider. Call your provider if the bandage seems to make your injury worse.  Elevation Raise the injured area above the level of your heart while you're sitting or lying down. Use a pillow to support your injured area as needed. Follow these instructions at home: If your symptoms get worse or last a long time, make a follow-up appointment with your provider. You may need to have imaging tests, such as X-rays or an MRI. If you have imaging tests, ask how to get your results when they are ready. Contact a health care provider if: You keep having pain and swelling. Your symptoms get worse. Get help right away if: You have sudden, very bad pain at your injury or lower than your injury. You have tingling or numbness at your injury or lower than your injury, and it does not go away when you take the bandage off. This information is not intended to replace advice given to you by your health care provider. Make sure you discuss any questions you have with your health care provider. Document Revised: 08/13/2023 Document Reviewed: 01/26/2023 Elsevier Patient Education  2024 [employer].

## 2024-12-29 NOTE — Telephone Encounter (Signed)
 Appointment scheduled.

## 2025-01-13 ENCOUNTER — Ambulatory Visit: Payer: Self-pay | Admitting: Nurse Practitioner
